# Patient Record
Sex: Female | Born: 1970
Health system: Southern US, Community
[De-identification: ages and names within clinical notes are randomized; demographics above are authoritative.]

## PROBLEM LIST (undated history)

## (undated) DIAGNOSIS — B009 Herpesviral infection, unspecified: Secondary | ICD-10-CM

## (undated) DIAGNOSIS — C439 Malignant melanoma of skin, unspecified: Secondary | ICD-10-CM

## (undated) DIAGNOSIS — G43109 Migraine with aura, not intractable, without status migrainosus: Secondary | ICD-10-CM

## (undated) DIAGNOSIS — Z9889 Other specified postprocedural states: Secondary | ICD-10-CM

## (undated) DIAGNOSIS — K219 Gastro-esophageal reflux disease without esophagitis: Secondary | ICD-10-CM

## (undated) DIAGNOSIS — R112 Nausea with vomiting, unspecified: Secondary | ICD-10-CM

## (undated) DIAGNOSIS — R87619 Unspecified abnormal cytological findings in specimens from cervix uteri: Secondary | ICD-10-CM

## (undated) HISTORY — PX: WRIST FRACTURE SURGERY: SHX121

## (undated) HISTORY — DX: Migraine with aura, not intractable, without status migrainosus: G43.109

## (undated) HISTORY — DX: Unspecified abnormal cytological findings in specimens from cervix uteri: R87.619

## (undated) HISTORY — DX: Gastro-esophageal reflux disease without esophagitis: K21.9

## (undated) HISTORY — DX: Malignant melanoma of skin, unspecified: C43.9

## (undated) HISTORY — DX: Herpesviral infection, unspecified: B00.9

---

## 1999-05-02 ENCOUNTER — Other Ambulatory Visit: Admission: RE | Admit: 1999-05-02 | Discharge: 1999-05-02 | Payer: Self-pay | Admitting: Obstetrics & Gynecology

## 1999-07-21 ENCOUNTER — Ambulatory Visit (HOSPITAL_COMMUNITY): Admission: RE | Admit: 1999-07-21 | Discharge: 1999-07-21 | Payer: Self-pay | Admitting: Internal Medicine

## 1999-11-30 ENCOUNTER — Inpatient Hospital Stay (HOSPITAL_COMMUNITY): Admission: AD | Admit: 1999-11-30 | Discharge: 1999-11-30 | Payer: Self-pay | Admitting: Obstetrics & Gynecology

## 1999-12-02 ENCOUNTER — Inpatient Hospital Stay (HOSPITAL_COMMUNITY): Admission: AD | Admit: 1999-12-02 | Discharge: 1999-12-04 | Payer: Self-pay | Admitting: Obstetrics and Gynecology

## 1999-12-30 ENCOUNTER — Other Ambulatory Visit: Admission: RE | Admit: 1999-12-30 | Discharge: 1999-12-30 | Payer: Self-pay | Admitting: Obstetrics & Gynecology

## 2000-09-18 ENCOUNTER — Encounter: Admission: RE | Admit: 2000-09-18 | Discharge: 2000-11-07 | Payer: Self-pay | Admitting: Internal Medicine

## 2001-01-08 ENCOUNTER — Other Ambulatory Visit: Admission: RE | Admit: 2001-01-08 | Discharge: 2001-01-08 | Payer: Self-pay | Admitting: Obstetrics & Gynecology

## 2001-09-22 ENCOUNTER — Inpatient Hospital Stay (HOSPITAL_COMMUNITY): Admission: AD | Admit: 2001-09-22 | Discharge: 2001-09-24 | Payer: Self-pay | Admitting: Obstetrics & Gynecology

## 2001-10-22 ENCOUNTER — Other Ambulatory Visit: Admission: RE | Admit: 2001-10-22 | Discharge: 2001-10-22 | Payer: Self-pay | Admitting: Obstetrics & Gynecology

## 2002-06-13 ENCOUNTER — Other Ambulatory Visit: Admission: RE | Admit: 2002-06-13 | Discharge: 2002-06-13 | Payer: Self-pay | Admitting: Obstetrics & Gynecology

## 2003-04-02 ENCOUNTER — Other Ambulatory Visit: Admission: RE | Admit: 2003-04-02 | Discharge: 2003-04-02 | Payer: Self-pay | Admitting: Obstetrics & Gynecology

## 2004-05-24 ENCOUNTER — Encounter: Admission: RE | Admit: 2004-05-24 | Discharge: 2004-05-24 | Payer: Self-pay | Admitting: Internal Medicine

## 2005-05-17 ENCOUNTER — Encounter: Admission: RE | Admit: 2005-05-17 | Discharge: 2005-05-17 | Payer: Self-pay | Admitting: Internal Medicine

## 2005-05-24 ENCOUNTER — Encounter: Admission: RE | Admit: 2005-05-24 | Discharge: 2005-05-24 | Payer: Self-pay | Admitting: Internal Medicine

## 2005-06-14 ENCOUNTER — Encounter: Admission: RE | Admit: 2005-06-14 | Discharge: 2005-06-14 | Payer: Self-pay | Admitting: Internal Medicine

## 2005-07-12 ENCOUNTER — Encounter: Admission: RE | Admit: 2005-07-12 | Discharge: 2005-07-12 | Payer: Self-pay | Admitting: Internal Medicine

## 2005-07-18 ENCOUNTER — Encounter: Admission: RE | Admit: 2005-07-18 | Discharge: 2005-07-18 | Payer: Self-pay | Admitting: Internal Medicine

## 2005-08-09 ENCOUNTER — Encounter: Admission: RE | Admit: 2005-08-09 | Discharge: 2005-08-09 | Payer: Self-pay | Admitting: Internal Medicine

## 2005-12-21 ENCOUNTER — Encounter: Admission: RE | Admit: 2005-12-21 | Discharge: 2005-12-21 | Payer: Self-pay | Admitting: Interventional Radiology

## 2006-01-23 ENCOUNTER — Encounter: Admission: RE | Admit: 2006-01-23 | Discharge: 2006-01-23 | Payer: Self-pay | Admitting: Obstetrics & Gynecology

## 2006-08-28 ENCOUNTER — Encounter: Admission: RE | Admit: 2006-08-28 | Discharge: 2006-08-28 | Payer: Self-pay | Admitting: Interventional Radiology

## 2006-09-13 ENCOUNTER — Encounter: Admission: RE | Admit: 2006-09-13 | Discharge: 2006-09-13 | Payer: Self-pay | Admitting: Interventional Radiology

## 2006-10-23 ENCOUNTER — Encounter: Admission: RE | Admit: 2006-10-23 | Discharge: 2006-10-23 | Payer: Self-pay | Admitting: Interventional Radiology

## 2010-05-01 ENCOUNTER — Encounter: Payer: Self-pay | Admitting: Internal Medicine

## 2010-05-01 ENCOUNTER — Encounter: Payer: Self-pay | Admitting: Interventional Radiology

## 2010-05-11 HISTORY — PX: COLPOSCOPY: SHX161

## 2010-08-26 NOTE — H&P (Signed)
[facility]  Patient:    Ashley Quinn, Ashley Quinn                  MRN: [id number] Adm. Date:  12/02/99 Attending:  Marina Gravel, M.D.                         History and Physical  DATE OF BIRTH:                06/04/1970  HISTORY OF PRESENT ILLNESS:   Ms. Ashley Quinn is a 40 year old white female, gravida 1, para 0, at 39+ weeks who presented to the office today with complaints of leakage of clear fluid and a large gush at 8:15 this morning. She reports occasional mild contractions.  No vaginal bleeding.  Active fetus, at least 10 movements per a 12-hour period.  Prenatal care has been at [facility], followed by Dr. Seymour Bars.  The primary complicating factor has been a history of grade 4 hydronephrosis of the left fetal kidney.  This has been followed on serial ultrasound.  Most recent measurement of 0.9 cm on the right and frank hydronephrosis on the left side.  This patients situation had been discussed with Dr. Conni Slipper at [facility].  Plan was to not induce the patient earlier than 39 weeks as it would not likely improve the hydronephrosis.  In addition, the patients pediatrician has been notified of the situation.  Recommendations from Dr. Clearance Coots are to obtain a renal ultrasound within the first week of life to begin assessment.  DATA CRITERIA:                Last menstrual period February 28, 1999, [facility] December 06, 1999, which is consistent with a 20-week ultrasound.  Remainder of the fetal anatomy was normal.  PRENATAL LABORATORIES:        O negative, Rh antibody screen negative.  RPR, hepatitis B, HIV negative, rubella immune, hemoglobin 14.2.  Patient was administered RhoGAM at 28 weeks.  Group B strep positive.  PAST MEDICAL HISTORY:         None.  PAST SURGICAL HISTORY:        Wrist surgery, February 2000.  MEDICATIONS:                  Prenatal vitamins.  ALLERGIES:                    None.  SOCIAL HISTORY:                No alcohol, tobacco, or other drugs.  PHYSICAL EXAMINATION:  VITAL SIGNS:                  Blood pressure 120/80, weight 166.4, fetal heart tones 140.  GENERAL:                      The patient is alert and oriented in no acute distress.  ABDOMEN:                      Soft and nontender.  Fundal height 39 cm.  No fundal tenderness.  PELVIC:                       Normal external female genitalia.  Speculum exam reveals copious amounts of frank amniotic fluid with vernix noted.  Pool nitrazine and ferning positive.  Vaginal exam:  Cervix is extremely posterior, approximately 1 cm dilated, 70% effaced, and 0 station.  ASSESSMENT:                   Term gravida 1, group B strep positive with spontaneous rupture of membranes today.  PLAN:                         Admit for Pitocin induction.  Penicillin for group B strep.  Pediatrician to be notified of the fetal hydronephrosis. DD:  12/02/99 TD:  12/02/99 Job: [id number]

## 2011-02-05 ENCOUNTER — Inpatient Hospital Stay (INDEPENDENT_AMBULATORY_CARE_PROVIDER_SITE_OTHER)
Admission: RE | Admit: 2011-02-05 | Discharge: 2011-02-05 | Disposition: A | Discharge status: Home / Self Care | Payer: 59 | Source: Ambulatory Visit | Attending: Family Medicine | Admitting: Family Medicine

## 2011-02-05 DIAGNOSIS — T2610XA Burn of cornea and conjunctival sac, unspecified eye, initial encounter: Secondary | ICD-10-CM

## 2011-06-05 ENCOUNTER — Other Ambulatory Visit: Payer: Self-pay | Admitting: Obstetrics and Gynecology

## 2011-06-05 DIAGNOSIS — R928 Other abnormal and inconclusive findings on diagnostic imaging of breast: Secondary | ICD-10-CM

## 2011-06-12 ENCOUNTER — Ambulatory Visit
Admission: RE | Admit: 2011-06-12 | Discharge: 2011-06-12 | Disposition: A | Discharge status: Home / Self Care | Payer: 59 | Source: Ambulatory Visit | Attending: Obstetrics and Gynecology | Admitting: Obstetrics and Gynecology

## 2011-06-12 DIAGNOSIS — R928 Other abnormal and inconclusive findings on diagnostic imaging of breast: Secondary | ICD-10-CM

## 2012-06-18 ENCOUNTER — Other Ambulatory Visit: Payer: Self-pay

## 2012-06-18 DIAGNOSIS — Z1231 Encounter for screening mammogram for malignant neoplasm of breast: Secondary | ICD-10-CM

## 2012-07-12 ENCOUNTER — Ambulatory Visit
Admission: RE | Admit: 2012-07-12 | Discharge: 2012-07-12 | Disposition: A | Discharge status: Home / Self Care | Payer: 59 | Source: Ambulatory Visit

## 2012-07-12 DIAGNOSIS — Z1231 Encounter for screening mammogram for malignant neoplasm of breast: Secondary | ICD-10-CM

## 2012-11-08 DIAGNOSIS — C439 Malignant melanoma of skin, unspecified: Secondary | ICD-10-CM

## 2012-11-08 HISTORY — DX: Malignant melanoma of skin, unspecified: C43.9

## 2012-11-08 HISTORY — PX: OTHER SURGICAL HISTORY: SHX169

## 2013-06-09 ENCOUNTER — Encounter: Payer: Self-pay | Admitting: Obstetrics and Gynecology

## 2013-06-23 ENCOUNTER — Ambulatory Visit: Payer: Self-pay | Admitting: Obstetrics and Gynecology

## 2013-06-25 ENCOUNTER — Encounter: Payer: Self-pay | Admitting: Obstetrics and Gynecology

## 2013-06-26 ENCOUNTER — Ambulatory Visit (INDEPENDENT_AMBULATORY_CARE_PROVIDER_SITE_OTHER): Payer: 59 | Admitting: Obstetrics and Gynecology

## 2013-06-26 ENCOUNTER — Encounter: Payer: Self-pay | Admitting: Obstetrics and Gynecology

## 2013-06-26 VITALS — BP 100/78 | HR 70 | Resp 16 | Ht 69.5 in | Wt 154.5 lb

## 2013-06-26 DIAGNOSIS — Z0000 Encounter for general adult medical examination without abnormal findings: Secondary | ICD-10-CM

## 2013-06-26 DIAGNOSIS — N920 Excessive and frequent menstruation with regular cycle: Secondary | ICD-10-CM

## 2013-06-26 DIAGNOSIS — Z01419 Encounter for gynecological examination (general) (routine) without abnormal findings: Secondary | ICD-10-CM

## 2013-06-26 LAB — POCT URINALYSIS DIPSTICK
BILIRUBIN UA: NEGATIVE
Glucose, UA: NEGATIVE
KETONES UA: NEGATIVE
Leukocytes, UA: NEGATIVE
Nitrite, UA: NEGATIVE
PH UA: 5
Protein, UA: NEGATIVE
Urobilinogen, UA: NEGATIVE

## 2013-06-26 LAB — CBC
HEMATOCRIT: 38.9 % (ref 36.0–46.0)
Hemoglobin: 13.2 g/dL (ref 12.0–15.0)
MCH: 29.1 pg (ref 26.0–34.0)
MCHC: 33.9 g/dL (ref 30.0–36.0)
MCV: 85.9 fL (ref 78.0–100.0)
Platelets: 189 10*3/uL (ref 150–400)
RBC: 4.53 MIL/uL (ref 3.87–5.11)
RDW: 13.8 % (ref 11.5–15.5)
WBC: 4.7 10*3/uL (ref 4.0–10.5)

## 2013-06-26 LAB — COMPREHENSIVE METABOLIC PANEL
ALBUMIN: 4.1 g/dL (ref 3.5–5.2)
ALK PHOS: 53 U/L (ref 39–117)
ALT: 14 U/L (ref 0–35)
AST: 17 U/L (ref 0–37)
BUN: 14 mg/dL (ref 6–23)
CO2: 27 mEq/L (ref 19–32)
Calcium: 9.3 mg/dL (ref 8.4–10.5)
Chloride: 103 mEq/L (ref 96–112)
Creat: 0.68 mg/dL (ref 0.50–1.10)
Glucose, Bld: 72 mg/dL (ref 70–99)
POTASSIUM: 4.1 meq/L (ref 3.5–5.3)
SODIUM: 135 meq/L (ref 135–145)
Total Bilirubin: 0.4 mg/dL (ref 0.2–1.2)
Total Protein: 6.6 g/dL (ref 6.0–8.3)

## 2013-06-26 LAB — LIPID PANEL
Cholesterol: 159 mg/dL (ref 0–200)
HDL: 71 mg/dL (ref >39)
LDL CALC: 79 mg/dL (ref 0–99)
TRIGLYCERIDES: 43 mg/dL (ref <150)
Total CHOL/HDL Ratio: 2.2 Ratio
VLDL: 9 mg/dL (ref 0–40)

## 2013-06-26 LAB — TSH: TSH: 1.142 u[IU]/mL (ref 0.350–4.500)

## 2013-06-26 NOTE — Patient Instructions (Signed)

## 2013-06-26 NOTE — Progress Notes (Addendum)
Patient ID: Ashley Quinn, female   DOB: 10-Jun-1970, 43 y.o.   MRN: [id number] GYNECOLOGY VISIT  PCP:   None  Referring provider:   HPI: 43 y.o.   Married  Caucasian  female   G2P2002 with Patient's last menstrual period was 06/13/2013.   here for  AEX.  Menses heavy starting January 2015. Bleeding through super tampon in less than 45 minutes.  Bled through clothes.  No pain or discomfort.   Received Rx for low dose OCPs last year and developed headaches so stopped.  Used Mirena IUD and did well with that.   History of superficial thrombosis and not DVT, during pregnancy.   New diagnosis of melanoma of the left thigh this year.  Having regular dermatology checks.  Hgb:     Urine:  Neg.  Addendum - This shows a trace of RBCs.  Asymptomatic.  Will monitor.   GYNECOLOGIC HISTORY: Patient's last menstrual period was 06/13/2013. Sexually active:  yes Partner preference: female Contraception:   vasectomy Menopausal hormone therapy: no DES exposure:   no Blood transfusions:  no  Sexually transmitted diseases:   no GYN procedures and prior surgeries: Colposcopy 2012 but no treatment to cervix.  Last mammogram:  07/2012 wnl:[facility]               Last pap and high risk HPV testing:   2013 HQI:ONGEXB of HPV testing History of abnormal pap smear:  2002 had AGUS pap.  2012 had pap showing LGSIL. Colposcopy showed atypia with HPV changes.   No treatment to cervix.  Paps returned to normal on repeat pap.   OB History   Grav Para Term Preterm Abortions TAB SAB Ect Mult Living   2 2 2       2        LIFESTYLE: Exercise:   Weights and aerobics            Tobacco:   no Alcohol:     2-3 glasses of beer/wine per week Drug use:  no  OTHER HEALTH MAINTENANCE: Tetanus/TDap:  Up to date through work ([employer]) Gardisil:              n/a Influenza:            01/2013 Zostavax:             n/a  Bone density:       n/a Colonoscopy:       n/a  Cholesterol check:    wnl  Family History  Problem Relation Age of Onset  . Breast cancer Mother   . Hypertension Mother   . Osteoporosis Mother   . Heart disease Paternal Grandmother   . Hyperlipidemia Father   . Prostate cancer Father   . Cancer Father     bladder ca    There are no active problems to display for this patient.  Past Medical History  Diagnosis Date  . DVT (deep venous thrombosis)   . GERD (gastroesophageal reflux disease)   . Abnormal Pap smear of cervix   . Melanoma 11/2012    left thigh    Past Surgical History  Procedure Laterality Date  . Wrist fracture surgery Left   . Colposcopy  05/2010    Mild Dysplasia  . Melanoma removal  11/2012    -left thigh    ALLERGIES: Review of patient's allergies indicates no known allergies.  Current Outpatient Prescriptions  Medication Sig Dispense Refill  . b complex vitamins tablet Take 1 tablet  by mouth daily.      . cetirizine (ZYRTEC) 10 MG tablet Take 10 mg by mouth daily.      Marland Kitchen glucosamine-chondroitin 500-400 MG tablet Take 1 tablet by mouth 3 (three) times daily.      . Multiple Vitamin (MULTIVITAMIN) tablet Take 1 tablet by mouth daily.       No current facility-administered medications for this visit.     ROS:  Pertinent items are noted in HPI.  SOCIAL HISTORY:  Works for [employer].   PHYSICAL EXAMINATION:    BP 100/78  Pulse 70  Resp 16  Ht 5' 9.5" (1.765 m)  Wt 154 lb 8 oz (70.081 kg)  BMI 22.50 kg/m2  LMP 06/13/2013   Wt Readings from Last 3 Encounters:  06/26/13 154 lb 8 oz (70.081 kg)     Ht Readings from Last 3 Encounters:  06/26/13 5' 9.5" (1.765 m)    General appearance: alert, cooperative and appears stated age Head: Normocephalic, without obvious abnormality, atraumatic Neck: no adenopathy, supple, symmetrical, trachea midline and thyroid not enlarged, symmetric, no tenderness/mass/nodules Lungs: clear to auscultation bilaterally Breasts: Inspection negative, No nipple retraction or dimpling,  No nipple discharge or bleeding, No axillary or supraclavicular adenopathy, Normal to palpation without dominant masses Heart: regular rate and rhythm Abdomen: soft, non-tender; no masses,  no organomegaly Extremities: extremities normal, atraumatic, no cyanosis or edema Skin: Skin color, texture, turgor normal. No rashes or lesions Lymph nodes: Cervical, supraclavicular, and axillary nodes normal. No abnormal inguinal nodes palpated Neurologic: Grossly normal  Pelvic: External genitalia:  no lesions              Urethra:  normal appearing urethra with no masses, tenderness or lesions              Bartholins and Skenes: normal                 Vagina: normal appearing vagina with normal color and discharge, no lesions              Cervix: normal appearance              Pap and high risk HPV testing done: yes.            Bimanual Exam:  Uterus:  uterus is normal size, shape, consistency and nontender                                      Adnexa: normal adnexa in size, nontender and no masses                                      Rectovaginal: Confirms                                      Anus:  normal sphincter tone, no lesions  ASSESSMENT  Normal gynecologic exam. History of cervical atypia and HPV.  History of AGUS. Menorrhagia.  PLAN  Mammogram recommended yearly.  Patient will schedule in April 2015.  Pap smear and high risk HPV testing performed.  Counseled on self breast exam. Return for pelvic ultrasound, sonohysterogram, and EMB. Lipid profile, CBC, CMP, TSH. Return annually or prn   An After Visit Summary was printed and given  to the patient.

## 2013-06-30 ENCOUNTER — Telehealth: Payer: Self-pay | Admitting: Obstetrics and Gynecology

## 2013-06-30 LAB — IPS PAP TEST WITH HPV

## 2013-06-30 NOTE — Telephone Encounter (Signed)
Left message for patient to call back  

## 2013-07-02 NOTE — Telephone Encounter (Signed)
Left message to call back. Need to go over benefits for and scheduled pus/endo bx

## 2013-07-07 NOTE — Telephone Encounter (Signed)
Left message for patient to call back  

## 2013-07-08 NOTE — Telephone Encounter (Signed)
Left message for patient to call back  

## 2013-07-08 NOTE — Telephone Encounter (Signed)
Patient returning Sabrina's call. °

## 2013-07-08 NOTE — Telephone Encounter (Signed)
Spoke with patient. Advised of $136.56 patient responsibility for SHGM/Endo BX. Scheduled procedures. Advised patient of cancelllation policy. Mailed  the In-Office Procedure form. Advising patient of appointment date and time, co-pay, and cancellation policy.

## 2013-07-23 ENCOUNTER — Other Ambulatory Visit: Payer: Self-pay

## 2013-07-23 DIAGNOSIS — Z1231 Encounter for screening mammogram for malignant neoplasm of breast: Secondary | ICD-10-CM

## 2013-07-24 ENCOUNTER — Other Ambulatory Visit: Payer: Self-pay | Admitting: Obstetrics and Gynecology

## 2013-07-24 ENCOUNTER — Encounter: Payer: Self-pay | Admitting: Obstetrics and Gynecology

## 2013-07-24 ENCOUNTER — Ambulatory Visit (INDEPENDENT_AMBULATORY_CARE_PROVIDER_SITE_OTHER): Payer: 59

## 2013-07-24 ENCOUNTER — Ambulatory Visit (INDEPENDENT_AMBULATORY_CARE_PROVIDER_SITE_OTHER): Payer: 59 | Admitting: Obstetrics and Gynecology

## 2013-07-24 VITALS — BP 110/70 | HR 64 | Ht 69.5 in | Wt 154.0 lb

## 2013-07-24 DIAGNOSIS — N9489 Other specified conditions associated with female genital organs and menstrual cycle: Secondary | ICD-10-CM

## 2013-07-24 DIAGNOSIS — N920 Excessive and frequent menstruation with regular cycle: Secondary | ICD-10-CM

## 2013-07-24 NOTE — Progress Notes (Signed)
Subjective  Patient is here for pelvic ultrasound, endometrial biopsy, sonohysterogram for heavy menses. Bleeds through clothing.  Some low back ache with menses treatable with ibuprofen.  Had migraine headaches with ultralow dose birth control pills.  Declines future childbearing.  Husband has vasectomy.   Objective  See ultrasound below -  Uterus normal with EMS 10.20 mm, no abnormal flow.  Ovaries normal.     Procedure - sonohysterogram Consent performed. Speculum placed in vagina. Sterile prep of cervix with betadine. Tenaculum to anterior cervical lip.  Cannula placed inside endometrial cavity without difficult. Speculum removed. Sterile saline injected.     13 x 10 mm         filling defect noted. Cannula removed. No complication.   Procedure - endometrial biopsy Consent performed. Speculum place in vagina.  Sterile prep of cervix with betadine.  Tenaculum to anterior cervical lip.  Pipelle placed to  8 cm        cm without difficulty twice. Tissue obtained and sent to pathology. Speculum removed.  No complications.  Assessment  Menorrhagia. Endometrial mass. Possible arcuate uterus.   Plan  Wait for EMB results. I discussed options for care depending on the EMB. Patient may need at least a hysteroscopy with dilation and curettage and polypectomy.  This can be performed in conjunction with endometrial ablation - Thermachoice and laparoscopic bilateral salpingectomy.  Patient favoring this option.  The hysteroscopy could also be followed with Mirena IUD insertion or other medical management.  Hysterectomy with bilateral salpingectomy was also discussed.   Patient will consider options.   25 minutes face to face time of which over 50% was spent in counseling.   After visit summary to patient.

## 2013-07-24 NOTE — Patient Instructions (Signed)

## 2013-07-28 LAB — IPS OTHER TISSUE BIOPSY

## 2013-07-30 ENCOUNTER — Ambulatory Visit
Admission: RE | Admit: 2013-07-30 | Discharge: 2013-07-30 | Disposition: A | Discharge status: Home / Self Care | Payer: 59 | Source: Ambulatory Visit

## 2013-07-30 DIAGNOSIS — Z1231 Encounter for screening mammogram for malignant neoplasm of breast: Secondary | ICD-10-CM

## 2013-08-01 ENCOUNTER — Telehealth: Payer: Self-pay | Admitting: *Deleted

## 2013-08-01 NOTE — Telephone Encounter (Signed)
Patient notified benign endometrial biopsy.  Encouraged to call back if decides she would like to precede with treatment for menorrhagia.  Encounter closed.

## 2013-08-01 NOTE — Telephone Encounter (Signed)
Message copied by Jaymes Graff on Fri Aug 01, 2013  6:16 PM ------      Message from: [place]      Created: Tue Jul 29, 2013  2:30 PM       Please inform patient of negative endometrial biopsy.      Patient is considering an endometrial ablation and bilateral salpingectomy.       Please let me know how I can assist her with her menorrhagia. ------

## 2014-01-29 ENCOUNTER — Telehealth: Payer: Self-pay | Admitting: Obstetrics and Gynecology

## 2014-01-29 NOTE — Telephone Encounter (Signed)
LMTCB about a canceled appointment with Dr. Quincy Simmonds.

## 2014-01-29 NOTE — Telephone Encounter (Signed)
Thank you for reaching out to this appointment.  I will look forward to seeing her at a different time of the day as we no longer offer the 7 am time slots.

## 2014-02-03 ENCOUNTER — Encounter: Payer: Self-pay | Admitting: Sports Medicine

## 2014-02-03 ENCOUNTER — Ambulatory Visit (INDEPENDENT_AMBULATORY_CARE_PROVIDER_SITE_OTHER): Payer: 59 | Admitting: Sports Medicine

## 2014-02-03 VITALS — BP 104/73 | Ht 70.0 in | Wt 150.0 lb

## 2014-02-03 DIAGNOSIS — M25531 Pain in right wrist: Secondary | ICD-10-CM | POA: Insufficient documentation

## 2014-02-03 NOTE — Progress Notes (Signed)
Patient ID: Varsha S. Riffe-Guyer, female   DOB: [birth year], 43 y.o.   MRN: [id number]  Patient enters w 5 mos of Rt wrist pain that is sharp and intermittent/ occurs lateral ulnar border at wrist Not aware of a specific injury She does note that sometimes lifting or body pump may result in some soreness and attacks However, at other times sharp pain comes with lifting a coffee cup or normal activity  No swelling NO HX arthritis  Left handed and hx of scaphoid FX left wrist after a fall in HS basketball This was pinned After coming to [place] revised by Dr Amedeo Plenty Has some limited motion of left wrist  PEXAM NAD BP 104/73  Ht 5\' 10"  (1.778 m)  Wt 150 lb (68.04 kg)  BMI 21.52 kg/m2  Left wrist with scarring and decrease in full flexion and extension  RT wrist Normal range of motion Prominent ulna but not excessive mobility May be neg ulnar variance slt weakness on grip but other motions are strong Area of pain is over ECU tendon but not swollen today and I cannot get it to sublux

## 2014-02-03 NOTE — Assessment & Plan Note (Signed)
We will use a compression sleeve over the wrist for 6 weeks Avoid ulnar deviation HEP with light weight for wrist flexion and ext/ hand grip  If not better in 6 weeks we will do full scan with [place] and consider XR

## 2014-02-09 ENCOUNTER — Encounter: Payer: Self-pay | Admitting: Sports Medicine

## 2014-06-30 ENCOUNTER — Ambulatory Visit (INDEPENDENT_AMBULATORY_CARE_PROVIDER_SITE_OTHER): Payer: 59 | Admitting: Certified Nurse Midwife

## 2014-06-30 ENCOUNTER — Encounter: Payer: Self-pay | Admitting: Certified Nurse Midwife

## 2014-06-30 VITALS — BP 98/60 | HR 88 | Resp 16 | Ht 69.5 in | Wt 148.8 lb

## 2014-06-30 DIAGNOSIS — Z01419 Encounter for gynecological examination (general) (routine) without abnormal findings: Secondary | ICD-10-CM

## 2014-06-30 DIAGNOSIS — Z124 Encounter for screening for malignant neoplasm of cervix: Secondary | ICD-10-CM | POA: Diagnosis not present

## 2014-06-30 DIAGNOSIS — Z0000 Encounter for general adult medical examination without abnormal findings: Secondary | ICD-10-CM

## 2014-06-30 DIAGNOSIS — R319 Hematuria, unspecified: Secondary | ICD-10-CM | POA: Diagnosis not present

## 2014-06-30 LAB — URINALYSIS, MICROSCOPIC ONLY
Bacteria, UA: NONE SEEN
Casts: NONE SEEN
Crystals: NONE SEEN
SQUAMOUS EPITHELIAL / LPF: NONE SEEN

## 2014-06-30 LAB — POCT URINALYSIS DIPSTICK
Leukocytes, UA: NEGATIVE
Urobilinogen, UA: NEGATIVE
pH, UA: 5

## 2014-06-30 LAB — TSH: TSH: 0.907 u[IU]/mL (ref 0.350–4.500)

## 2014-06-30 LAB — CBC
HCT: 43.2 % (ref 36.0–46.0)
Hemoglobin: 14.5 g/dL (ref 12.0–15.0)
MCH: 29.4 pg (ref 26.0–34.0)
MCHC: 33.6 g/dL (ref 30.0–36.0)
MCV: 87.4 fL (ref 78.0–100.0)
MPV: 9.9 fL (ref 8.6–12.4)
Platelets: 238 10*3/uL (ref 150–400)
RBC: 4.94 MIL/uL (ref 3.87–5.11)
RDW: 13.1 % (ref 11.5–15.5)
WBC: 5.3 10*3/uL (ref 4.0–10.5)

## 2014-06-30 LAB — COMPREHENSIVE METABOLIC PANEL
ALBUMIN: 4.3 g/dL (ref 3.5–5.2)
ALT: 24 U/L (ref 0–35)
AST: 20 U/L (ref 0–37)
Alkaline Phosphatase: 59 U/L (ref 39–117)
BUN: 20 mg/dL (ref 6–23)
CALCIUM: 9.3 mg/dL (ref 8.4–10.5)
CO2: 28 meq/L (ref 19–32)
Chloride: 105 mEq/L (ref 96–112)
Creat: 0.66 mg/dL (ref 0.50–1.10)
GLUCOSE: 80 mg/dL (ref 70–99)
POTASSIUM: 4.9 meq/L (ref 3.5–5.3)
SODIUM: 138 meq/L (ref 135–145)
TOTAL PROTEIN: 6.7 g/dL (ref 6.0–8.3)
Total Bilirubin: 0.5 mg/dL (ref 0.2–1.2)

## 2014-06-30 LAB — LIPID PANEL
CHOL/HDL RATIO: 2.7 ratio
Cholesterol: 157 mg/dL (ref 0–200)
HDL: 59 mg/dL (ref >=46)
LDL CALC: 85 mg/dL (ref 0–99)
Triglycerides: 66 mg/dL (ref <150)
VLDL: 13 mg/dL (ref 0–40)

## 2014-06-30 LAB — HEMOGLOBIN, FINGERSTICK: Hemoglobin, fingerstick: 13.8 g/dL (ref 12.0–16.0)

## 2014-06-30 NOTE — Patient Instructions (Signed)

## 2014-06-30 NOTE — Progress Notes (Signed)
44 y.o. G71P2002 Married  Caucasian Fe here for annual exam.  Periods unchanged, bleeding profile has not increased. Has some changes with duration and amount. Not interested in cycle control and will advise. Denies UTI symptoms. Sees PCP prn. No health issues today.   Patient's last menstrual period was 06/07/2014.          Sexually active: Yes.    The current method of family planning is Husband has vasectomy.    Exercising: Yes.    body pump, walking 4x/wk Smoker:  no  Health Maintenance: Pap:  06/2013 NEG HR HPV negative -HX of abnormal pap: 2002 AGUS Pap; 05/26/10 LGSIL colpo bx done atypia with HPV effect MMG:  07/31/13 Bi-Rads 1: Negative  Colonoscopy:  Never had one BMD:   Never had one TDaP:  Within 10 years  Labs: Hgb: 13.8 ; Urine: RBC's ++   reports that she has never smoked. She has never used smokeless tobacco. She reports that she drinks about 1.2 - 1.8 oz of alcohol per week. She reports that she does not use illicit drugs.  Past Medical History  Diagnosis Date  . DVT (deep venous thrombosis)   . GERD (gastroesophageal reflux disease)   . Abnormal Pap smear of cervix   . Melanoma 11/2012    left thigh    Past Surgical History  Procedure Laterality Date  . Wrist fracture surgery Left   . Colposcopy  05/2010    Mild Dysplasia  . Melanoma removal  11/2012    -left thigh    Current Outpatient Prescriptions  Medication Sig Dispense Refill  . b complex vitamins tablet Take 1 tablet by mouth daily.    . cetirizine (ZYRTEC) 10 MG tablet Take 10 mg by mouth daily.    Marland Kitchen glucosamine-chondroitin 500-400 MG tablet Take 1 tablet by mouth 3 (three) times daily.    . Multiple Vitamin (MULTIVITAMIN) tablet Take 1 tablet by mouth daily.    . Probiotic Product (PROBIOTIC PO) Take by mouth.     No current facility-administered medications for this visit.    Family History  Problem Relation Age of Onset  . Breast cancer Mother   . Hypertension Mother   . Osteoporosis Mother    . Heart disease Paternal Grandmother   . Hyperlipidemia Father   . Prostate cancer Father   . Cancer Father     bladder ca    ROS:  Pertinent items are noted in HPI.  Otherwise, a comprehensive ROS was negative.  Exam:   BP 98/60 mmHg  Pulse 88  Resp 16  Ht 5' 9.5" (1.765 m)  Wt 148 lb 12.8 oz (67.495 kg)  BMI 21.67 kg/m2  LMP 06/07/2014 Height: 5' 9.5" (176.5 cm) Ht Readings from Last 3 Encounters:  06/30/14 5' 9.5" (1.765 m)  02/03/14 5\' 10"  (1.778 m)  07/24/13 5' 9.5" (1.765 m)    General appearance: alert, cooperative and appears stated age Head: Normocephalic, without obvious abnormality, atraumatic Neck: no adenopathy, supple, symmetrical, trachea midline and thyroid normal to inspection and palpation Lungs: clear to auscultation bilaterally Breasts: normal appearance, no masses or tenderness, No nipple retraction or dimpling, No nipple discharge or bleeding, No axillary or supraclavicular adenopathy Heart: regular rate and rhythm Abdomen: soft, non-tender; no masses,  no organomegaly Extremities: extremities normal, atraumatic, no cyanosis or edema Skin: Skin color, texture, turgor normal. No rashes or lesions Lymph nodes: Cervical, supraclavicular, and axillary nodes normal. No abnormal inguinal nodes palpated Neurologic: Grossly normal   Pelvic: External genitalia:  no lesions              Urethra:  normal appearing urethra with no masses, tenderness or lesions              Bartholin's and Skene's: normal                 Vagina: normal appearing vagina with normal color and discharge, no lesions              Cervix: normal, non tender, no lesions              Pap taken: Yes.   Bimanual Exam:  Uterus:  normal size, contour, position, consistency, mobility, non-tender              Adnexa: normal adnexa and no mass, fullness, tenderness               Rectovaginal: Confirms               Anus:  normal sphincter tone, no lesions, slight increase in pink and slight  irritated appearance, non tender, shown to patient in mirror and instructions given for care  Chaperone present: Yes  A:  Well Woman with normal exam  History of menorrhagia with no increase, stable at present, no anemia HGB 13.8, not interested in cycle control  R/O UTI RBC in urine, no symptoms  Family history of breast cancer mother age 14 ? BRACA screening  Rectal irritation ? Dryness  Screening labs  P:   Reviewed health and wellness pertinent to exam  Patient aware of warning signs with bleeding and will advise if change  Encouraged to increase water intake, aware of UTI symptoms,  Lab Urine micro  Patient will check with mother on screening  Patient instructed to use 1/2% hydrocortisone to area for no longer than 5 days and Balmex protection if needed. Advise if not clearing.  Labs: Lipid panel, CMP, TSH,CBC, Vitamin D  Pap smear taken today with HPV reflex   counseled on breast self exam, mammography screening, adequate intake of calcium and vitamin D, diet and exercise  return annually or prn  An After Visit Summary was printed and given to the patient.

## 2014-07-01 LAB — VITAMIN D 25 HYDROXY (VIT D DEFICIENCY, FRACTURES): Vit D, 25-Hydroxy: 32 ng/mL (ref 30–100)

## 2014-07-02 LAB — IPS PAP TEST WITH REFLEX TO HPV

## 2014-07-08 ENCOUNTER — Other Ambulatory Visit: Payer: Self-pay

## 2014-07-08 DIAGNOSIS — Z1231 Encounter for screening mammogram for malignant neoplasm of breast: Secondary | ICD-10-CM

## 2014-07-08 NOTE — Progress Notes (Signed)
Reviewed personally.  M. Suzanne Orel Hord, MD.  

## 2014-07-09 ENCOUNTER — Ambulatory Visit: Payer: Self-pay | Admitting: Obstetrics and Gynecology

## 2014-08-07 ENCOUNTER — Ambulatory Visit
Admission: RE | Admit: 2014-08-07 | Discharge: 2014-08-07 | Disposition: A | Discharge status: Home / Self Care | Payer: 59 | Source: Ambulatory Visit

## 2014-08-07 DIAGNOSIS — Z1231 Encounter for screening mammogram for malignant neoplasm of breast: Secondary | ICD-10-CM

## 2015-06-17 DIAGNOSIS — Z9889 Other specified postprocedural states: Secondary | ICD-10-CM | POA: Diagnosis not present

## 2015-06-17 DIAGNOSIS — M25532 Pain in left wrist: Secondary | ICD-10-CM | POA: Diagnosis not present

## 2015-07-01 ENCOUNTER — Other Ambulatory Visit: Payer: Self-pay | Admitting: Obstetrics and Gynecology

## 2015-07-01 ENCOUNTER — Encounter: Payer: Self-pay | Admitting: Obstetrics and Gynecology

## 2015-07-01 ENCOUNTER — Ambulatory Visit (INDEPENDENT_AMBULATORY_CARE_PROVIDER_SITE_OTHER): Payer: 59 | Admitting: Obstetrics and Gynecology

## 2015-07-01 VITALS — BP 112/74 | HR 72 | Ht 70.5 in | Wt 156.0 lb

## 2015-07-01 DIAGNOSIS — D649 Anemia, unspecified: Secondary | ICD-10-CM | POA: Diagnosis not present

## 2015-07-01 DIAGNOSIS — Z01419 Encounter for gynecological examination (general) (routine) without abnormal findings: Secondary | ICD-10-CM | POA: Diagnosis not present

## 2015-07-01 DIAGNOSIS — Z0000 Encounter for general adult medical examination without abnormal findings: Secondary | ICD-10-CM | POA: Diagnosis not present

## 2015-07-01 DIAGNOSIS — Z1151 Encounter for screening for human papillomavirus (HPV): Secondary | ICD-10-CM | POA: Diagnosis not present

## 2015-07-01 LAB — LIPID PANEL
CHOL/HDL RATIO: 1.9 ratio (ref <=5.0)
CHOLESTEROL: 176 mg/dL (ref 125–200)
HDL: 94 mg/dL (ref >=46)
LDL Cholesterol: 71 mg/dL (ref <130)
TRIGLYCERIDES: 55 mg/dL (ref <150)
VLDL: 11 mg/dL (ref <30)

## 2015-07-01 LAB — POCT URINALYSIS DIPSTICK
Bilirubin, UA: NEGATIVE
Blood, UA: NEGATIVE
GLUCOSE UA: NEGATIVE
Ketones, UA: NEGATIVE
Leukocytes, UA: NEGATIVE
NITRITE UA: NEGATIVE
PROTEIN UA: NEGATIVE
UROBILINOGEN UA: NEGATIVE
pH, UA: 7

## 2015-07-01 LAB — HEMOGLOBIN, FINGERSTICK

## 2015-07-01 LAB — COMPREHENSIVE METABOLIC PANEL
ALT: 30 U/L — AB (ref 6–29)
AST: 31 U/L (ref 10–35)
Albumin: 4.2 g/dL (ref 3.6–5.1)
Alkaline Phosphatase: 53 U/L (ref 33–115)
BUN: 13 mg/dL (ref 7–25)
CHLORIDE: 103 mmol/L (ref 98–110)
CO2: 27 mmol/L (ref 20–31)
CREATININE: 0.72 mg/dL (ref 0.50–1.10)
Calcium: 9.2 mg/dL (ref 8.6–10.2)
GLUCOSE: 73 mg/dL (ref 65–99)
Potassium: 3.9 mmol/L (ref 3.5–5.3)
SODIUM: 138 mmol/L (ref 135–146)
TOTAL PROTEIN: 7.1 g/dL (ref 6.1–8.1)
Total Bilirubin: 0.5 mg/dL (ref 0.2–1.2)

## 2015-07-01 LAB — CBC
HEMATOCRIT: 35 % — AB (ref 36.0–46.0)
Hemoglobin: 11.2 g/dL — ABNORMAL LOW (ref 12.0–15.0)
MCH: 24.1 pg — AB (ref 26.0–34.0)
MCHC: 32 g/dL (ref 30.0–36.0)
MCV: 75.3 fL — AB (ref 78.0–100.0)
MPV: 10.8 fL (ref 8.6–12.4)
PLATELETS: 265 10*3/uL (ref 150–400)
RBC: 4.65 MIL/uL (ref 3.87–5.11)
RDW: 14.4 % (ref 11.5–15.5)
WBC: 7.3 10*3/uL (ref 4.0–10.5)

## 2015-07-01 NOTE — Addendum Note (Signed)
Addended by: Abelino Derrick C on: 07/01/2015 09:00 AM   Modules accepted: Orders, SmartSet

## 2015-07-01 NOTE — Progress Notes (Signed)
Patient ID: Ashley Quinn, female   DOB: 1971-04-10, 45 y.o.   MRN: [id number]  45 y.o. G19P2004 Married Caucasian female here for annual exam.    3 issues today: - Heartburn regularly.  Taking Prilosec for 14 days every other month. 2 coffees and one wine per 24 hours. States she noted the red wine definitely makes the reflux worse. Takes Advil 600 mg once a week for wrist pain.  - Hx of wrist fracture in past.  Did a cortisone injection last week.  May have future surgery.  Wants to stop her period for 3 - 5 months without going on the pill.  - Has very heavy menses.  Using ultra tampons. Used Mirena in the past without problems.   PCP:   None  Patient's last menstrual period was 06/24/2015 (exact date).          Sexually active: Yes.    The current method of family planning is vasectomy.    Exercising: Yes.    cardio and weights Smoker:  no  Health Maintenance: Pap:  06/30/14, Negative History of abnormal Pap:  Yes, 2002 AGUS Pap; 05/26/10 LGSIL colpo bx done atypia with HPV effect MMG: 08/07/14, 3D, Bi-Rads 1:  [facility] Colonoscopy:  Never BMD:   Never  Result  N/A TDaP:  UTD, [facility] Employee Screening Labs:   Urine today: Negative    reports that she has never smoked. She has never used smokeless tobacco. She reports that she drinks about 1.2 - 1.8 oz of alcohol per week. She reports that she does not use illicit drugs.  Past Medical History  Diagnosis Date  . DVT (deep venous thrombosis) ([place])   . GERD (gastroesophageal reflux disease)   . Abnormal Pap smear of cervix   . Melanoma ([place]) 11/2012    left thigh    Past Surgical History  Procedure Laterality Date  . Wrist fracture surgery Left   . Colposcopy  05/2010    Mild Dysplasia  . Melanoma removal  11/2012    -left thigh    Current Outpatient Prescriptions  Medication Sig Dispense Refill  . cetirizine (ZYRTEC) 10 MG tablet Take 10 mg by mouth daily.    . Multiple Vitamin (MULTIVITAMIN) tablet  Take 1 tablet by mouth daily.    Marland Kitchen omeprazole (PRILOSEC OTC) 20 MG tablet Take 20 mg by mouth daily as needed.    . Probiotic Product (PROBIOTIC PO) Take by mouth.     No current facility-administered medications for this visit.    Family History  Problem Relation Age of Onset  . Breast cancer Mother   . Hypertension Mother   . Osteoporosis Mother   . Heart disease Paternal Grandmother   . Hyperlipidemia Father   . Prostate cancer Father   . Cancer Father     bladder ca    ROS:  Pertinent items are noted in HPI.  Otherwise, a comprehensive ROS was negative.  Exam:   BP 112/74 mmHg  Pulse 72  Ht 5' 10.5" (1.791 m)  Wt 156 lb (70.761 kg)  BMI 22.06 kg/m2  LMP 06/24/2015 (Exact Date)    General appearance: alert, cooperative and appears stated age Head: Normocephalic, without obvious abnormality, atraumatic Neck: no adenopathy, supple, symmetrical, trachea midline and thyroid normal to inspection and palpation Lungs: clear to auscultation bilaterally Breasts: normal appearance, no masses or tenderness, Inspection negative, No nipple retraction or dimpling, No nipple discharge or bleeding, No axillary or supraclavicular adenopathy Heart: regular rate and rhythm  Abdomen: soft, non-tender; bowel sounds normal; no masses,  no organomegaly Extremities: extremities normal, atraumatic, no cyanosis or edema Skin: Skin color, texture, turgor normal. No rashes or lesions Lymph nodes: Cervical, supraclavicular, and axillary nodes normal. No abnormal inguinal nodes palpated Neurologic: Grossly normal  Pelvic: External genitalia:  no lesions              Urethra:  normal appearing urethra with no masses, tenderness or lesions              Bartholins and Skenes: normal                 Vagina: normal appearing vagina with normal color and discharge, no lesions              Cervix: no lesions              Pap taken: Yes.   Bimanual Exam:  Uterus:  normal size, contour, position,  consistency, mobility, non-tender              Adnexa: normal adnexa and no mass, fullness, tenderness              Rectovaginal: Yes.  .  Confirms.              Anus:  normal sphincter tone, no lesions  Chaperone was present for exam.  Assessment:   Well woman visit with normal exam. Remote hx of AGUS pap and LGSIL. Menorrhagia.  Desire for amenorrhea. FH of breast cancer in mother.  Hx DVT. Dyspepsia.  Discussed reduction in irritant use.  OK for daily Pepcid, Zantac.  If persists, to GI for potential endoscopy.  Hx melanoma.  Plan: Yearly mammogram recommended after age 24.  Recommended self breast exam.  Pap and HR HPV as above. Discussed Calcium, Vitamin D, regular exercise program including cardiovascular and weight bearing exercise. Labs performed.  Yes.  .   See orders.  Cholesterol, CMP, CBC. Refills given on medications.  No..    Discussed Mirena IUD.  Information to patient in verbal and written form.   See dermatology regularly for appointments.  Follow up annually and prn.      After visit summary provided.

## 2015-07-01 NOTE — Patient Instructions (Signed)

## 2015-07-02 ENCOUNTER — Other Ambulatory Visit: Payer: Self-pay | Admitting: Obstetrics and Gynecology

## 2015-07-02 DIAGNOSIS — R7989 Other specified abnormal findings of blood chemistry: Secondary | ICD-10-CM

## 2015-07-02 DIAGNOSIS — R945 Abnormal results of liver function studies: Principal | ICD-10-CM

## 2015-07-03 LAB — IRON: IRON: 38 ug/dL — AB (ref 40–190)

## 2015-07-03 LAB — FERRITIN: FERRITIN: 7 ng/mL — AB (ref 10–232)

## 2015-07-05 ENCOUNTER — Other Ambulatory Visit: Payer: Self-pay

## 2015-07-05 DIAGNOSIS — Z1231 Encounter for screening mammogram for malignant neoplasm of breast: Secondary | ICD-10-CM

## 2015-07-05 LAB — IPS PAP TEST WITH HPV

## 2015-08-09 ENCOUNTER — Ambulatory Visit
Admission: RE | Admit: 2015-08-09 | Discharge: 2015-08-09 | Disposition: A | Discharge status: Home / Self Care | Payer: 59 | Source: Ambulatory Visit

## 2015-08-09 DIAGNOSIS — Z1231 Encounter for screening mammogram for malignant neoplasm of breast: Secondary | ICD-10-CM | POA: Diagnosis not present

## 2015-09-07 ENCOUNTER — Encounter: Payer: Self-pay | Admitting: Emergency Medicine

## 2015-09-08 ENCOUNTER — Telehealth: Payer: Self-pay

## 2015-09-08 NOTE — Telephone Encounter (Signed)
-----   Message from Nunzio Cobbs, MD sent at 09/06/2015 10:04 AM EDT ----- Please let patient know that she is due to a repeat liver function panel.  She came up in my reminder box with EPIC.  Thanks,   Brook  ----- Message -----    From: SYSTEM    Sent: 09/06/2015  12:04 AM      To: Nunzio Cobbs, MD

## 2015-09-08 NOTE — Telephone Encounter (Signed)
Left message to call Briceson Broadwater at [phone]. 

## 2015-09-16 NOTE — Telephone Encounter (Signed)
Left message to call Bralynn Velador at [phone]. 

## 2015-09-21 NOTE — Telephone Encounter (Signed)
Dr.Silva, I have attempted to reach this patient x 2 without return call. Please advise.

## 2015-09-21 NOTE — Telephone Encounter (Signed)
I have closed the encounter after reviewing her labs.

## 2015-09-24 ENCOUNTER — Other Ambulatory Visit (INDEPENDENT_AMBULATORY_CARE_PROVIDER_SITE_OTHER): Payer: 59

## 2015-09-24 DIAGNOSIS — R7989 Other specified abnormal findings of blood chemistry: Secondary | ICD-10-CM | POA: Diagnosis not present

## 2015-09-24 DIAGNOSIS — R945 Abnormal results of liver function studies: Principal | ICD-10-CM

## 2015-09-24 DIAGNOSIS — R799 Abnormal finding of blood chemistry, unspecified: Secondary | ICD-10-CM | POA: Diagnosis not present

## 2015-09-24 LAB — HEPATIC FUNCTION PANEL
ALK PHOS: 55 U/L (ref 33–115)
ALT: 12 U/L (ref 6–29)
AST: 18 U/L (ref 10–35)
Albumin: 4.2 g/dL (ref 3.6–5.1)
BILIRUBIN DIRECT: 0.1 mg/dL (ref <=0.2)
BILIRUBIN TOTAL: 0.4 mg/dL (ref 0.2–1.2)
Indirect Bilirubin: 0.3 mg/dL (ref 0.2–1.2)
Total Protein: 6.7 g/dL (ref 6.1–8.1)

## 2015-09-29 DIAGNOSIS — H18413 Arcus senilis, bilateral: Secondary | ICD-10-CM | POA: Diagnosis not present

## 2015-10-04 ENCOUNTER — Telehealth: Payer: Self-pay

## 2015-10-04 NOTE — Telephone Encounter (Deleted)
-----   Message from Nunzio Cobbs, MD sent at 09/06/2015 10:04 AM EDT ----- Please let patient know that she is due to a repeat liver function panel.  She came up in my reminder box with EPIC.  Thanks,   Brook  ----- Message -----    From: SYSTEM    Sent: 09/06/2015  12:04 AM      To: Nunzio Cobbs, MD

## 2015-10-04 NOTE — Telephone Encounter (Signed)
Encounter opened in error

## 2015-11-22 DIAGNOSIS — H5213 Myopia, bilateral: Secondary | ICD-10-CM | POA: Diagnosis not present

## 2015-12-15 ENCOUNTER — Encounter: Payer: Self-pay | Admitting: Obstetrics and Gynecology

## 2015-12-15 ENCOUNTER — Telehealth: Payer: Self-pay | Admitting: *Deleted

## 2015-12-15 ENCOUNTER — Ambulatory Visit (INDEPENDENT_AMBULATORY_CARE_PROVIDER_SITE_OTHER): Payer: 59 | Admitting: Obstetrics and Gynecology

## 2015-12-15 VITALS — BP 118/62 | HR 76 | Ht 70.5 in | Wt 155.6 lb

## 2015-12-15 DIAGNOSIS — R5383 Other fatigue: Secondary | ICD-10-CM

## 2015-12-15 DIAGNOSIS — D500 Iron deficiency anemia secondary to blood loss (chronic): Secondary | ICD-10-CM | POA: Diagnosis not present

## 2015-12-15 DIAGNOSIS — N920 Excessive and frequent menstruation with regular cycle: Secondary | ICD-10-CM

## 2015-12-15 DIAGNOSIS — N939 Abnormal uterine and vaginal bleeding, unspecified: Secondary | ICD-10-CM

## 2015-12-15 LAB — CBC
HCT: 41.9 % (ref 35.0–45.0)
HEMOGLOBIN: 13.7 g/dL (ref 11.7–15.5)
MCH: 28.7 pg (ref 27.0–33.0)
MCHC: 32.7 g/dL (ref 32.0–36.0)
MCV: 87.7 fL (ref 80.0–100.0)
MPV: 10.7 fL (ref 7.5–12.5)
Platelets: 228 10*3/uL (ref 140–400)
RBC: 4.78 MIL/uL (ref 3.80–5.10)
RDW: 14 % (ref 11.0–15.0)
WBC: 6.6 10*3/uL (ref 3.8–10.8)

## 2015-12-15 LAB — POCT URINE PREGNANCY: PREG TEST UR: NEGATIVE

## 2015-12-15 LAB — HEMOGLOBIN, FINGERSTICK: HEMOGLOBIN, FINGERSTICK: 13.1 g/dL (ref 12.0–16.0)

## 2015-12-15 NOTE — Telephone Encounter (Signed)
Reviewed with Dr. Quincy Simmonds. Dr. Quincy Simmonds recommends patient be seen in office today for further evaluation. Spoke with patient, advised of recommendations from Dr. Quincy Simmonds. Offered appointment for today at [facility]. Patient is agreeable to date and time, appointment scheduled.   Routing to provider for final review. Patient is agreeable to disposition. Will close encounter.

## 2015-12-15 NOTE — Telephone Encounter (Signed)
Please see telephone encounter from 12/15/2015.

## 2015-12-15 NOTE — Progress Notes (Signed)
GYNECOLOGY  VISIT   HPI: 45 y.o.   Married  Caucasian  female   G2P2004 with Patient's last menstrual period was 12/13/2015 (exact date).   here for heavy menses and fatigue. Patient wants to discuss Mirena IUD.   Had Mirena in past and did well with it.   Menses heavy with clots and feels miserable.  Menses are every 4 weeks.  Can change pad every 1 - 2 hours and be very mindful at night.  Taking iron supplementation and eating iron rich foods.  Notes fatigue of recent onset.   Vasectomy for contraception.  No other partners.   HGB: 13.1 UPT:  Neg  GYNECOLOGIC HISTORY: Patient's last menstrual period was 12/13/2015 (exact date). Contraception:  Vasectomy Menopausal hormone therapy:  n/a Last mammogram:  08-10-15 Density B/Neg/BiRads1:[facility] Last pap smear:  07-01-15 Neg: Neg HR HPV        OB History    Gravida Para Term Preterm AB Living   2 2 2     4    SAB TAB Ectopic Multiple Live Births           2         Patient Active Problem List   Diagnosis Date Noted  . Wrist pain, right 02/03/2014  . Endometrial mass 07/24/2013  . Menorrhagia 06/26/2013    Past Medical History:  Diagnosis Date  . Abnormal Pap smear of cervix   . DVT (deep venous thrombosis) ([place])   . GERD (gastroesophageal reflux disease)   . Melanoma ([place]) 11/2012   left thigh    Past Surgical History:  Procedure Laterality Date  . COLPOSCOPY  05/2010   Mild Dysplasia  . melanoma removal  11/2012   -left thigh  . WRIST FRACTURE SURGERY Left     Current Outpatient Prescriptions  Medication Sig Dispense Refill  . cetirizine (ZYRTEC) 10 MG tablet Take 10 mg by mouth daily.    . Multiple Vitamin (MULTIVITAMIN) tablet Take 1 tablet by mouth daily.    . Probiotic Product (PROBIOTIC PO) Take by mouth.     No current facility-administered medications for this visit.      ALLERGIES: Review of patient's allergies indicates no known allergies.  Family History  Problem Relation Age of  Onset  . Breast cancer Mother   . Hypertension Mother   . Osteoporosis Mother   . Hyperlipidemia Father   . Prostate cancer Father   . Cancer Father     bladder ca  . Heart disease Paternal Grandmother     Social History   Social History  . Marital status: Married    Spouse name: N/A  . Number of children: N/A  . Years of education: N/A   Occupational History  . Not on file.   Social History Main Topics  . Smoking status: Never Smoker  . Smokeless tobacco: Never Used  . Alcohol use 1.2 - 1.8 oz/week    2 - 3 Glasses of wine per week  . Drug use: No  . Sexual activity: Yes    Partners: Male    Birth control/ protection: Other-see comments     Comment: husband w/vasectomy   Other Topics Concern  . Not on file   Social History Narrative  . No narrative on file    ROS:  Pertinent items are noted in HPI.  PHYSICAL EXAMINATION:    BP 118/62 (BP Location: Right Arm, Patient Position: Sitting, Cuff Size: Normal)   Pulse 76   Ht 5' 10.5" (  1.791 m)   Wt 155 lb 9.6 oz (70.6 kg)   LMP 12/13/2015 (Exact Date)   BMI 22.01 kg/m     General appearance: alert, cooperative and appears stated age  Pelvic: External genitalia:  no lesions              Urethra:  normal appearing urethra with no masses, tenderness or lesions              Bartholins and Skenes: normal                 Vagina: normal appearing vagina with normal color and discharge, no lesions.  Menstrual flow noted.               Cervix: no lesions                Bimanual Exam:  Uterus:  normal size, contour, position, consistency, mobility, non-tender              Adnexa: no mass, fullness, tenderness           Chaperone was present for exam.  ASSESSMENT  Menorrhagia.  Hx iron deficiency.  Fatigue. Hx DVT.  Vasectomy for permanent contraception.   PLAN  Check CBC, iron, ferritin, TSH.  Discussion of options for care - Micronor, Mirena, ablation, hysterectomy.  Prefers Mirena IUD.  Ok to insert  when not on menses.    An After Visit Summary was printed and given to the patient.  _15_____ minutes face to face time of which over 50% was spent in counseling.

## 2015-12-15 NOTE — Telephone Encounter (Signed)
Spoke with patient. Patient was last seen in office with Dr. Quincy Simmonds 07/01/15. Patient reports that she started her menses 2 days ago and is changing her tampon every 1-2 hours. Patient states that her cycles come regularly each month and are always heavy. Reports her cycles have not gotten heavier since last visit, but she is experiencing increased fatigue that is affecting her activities of daily living. Patient is interested in having Mirena placed at this time. Patients husband has had a vasectomy. Patient is available to be seen in office this week. Advised I will speak with Dr. Quincy Simmonds and return call with further recommendations.    Dr. Quincy Simmonds-  I hope you are well. I am having heavier and heavier periods and am definitely struggling with the associated fatigue even with iron supplementation. I would like to go ahead and get the Mirena that we discussed at my last visit. Can you please advise me as to my next steps in this process?   Thank you-  Ashley Quinn   Dr. Quincy Simmonds, [place] to proceed with IUD insertion, or would you like to see patient for further evaluation first?

## 2015-12-16 LAB — FERRITIN: FERRITIN: 14 ng/mL (ref 10–232)

## 2015-12-16 LAB — IRON: IRON: 149 ug/dL (ref 40–190)

## 2015-12-16 LAB — TSH: TSH: 0.77 mIU/L

## 2015-12-21 DIAGNOSIS — D2271 Melanocytic nevi of right lower limb, including hip: Secondary | ICD-10-CM | POA: Diagnosis not present

## 2015-12-21 DIAGNOSIS — L814 Other melanin hyperpigmentation: Secondary | ICD-10-CM | POA: Diagnosis not present

## 2015-12-21 DIAGNOSIS — D225 Melanocytic nevi of trunk: Secondary | ICD-10-CM | POA: Diagnosis not present

## 2015-12-21 DIAGNOSIS — L821 Other seborrheic keratosis: Secondary | ICD-10-CM | POA: Diagnosis not present

## 2015-12-21 DIAGNOSIS — Z85820 Personal history of malignant melanoma of skin: Secondary | ICD-10-CM | POA: Diagnosis not present

## 2015-12-21 DIAGNOSIS — D2262 Melanocytic nevi of left upper limb, including shoulder: Secondary | ICD-10-CM | POA: Diagnosis not present

## 2015-12-21 DIAGNOSIS — L245 Irritant contact dermatitis due to other chemical products: Secondary | ICD-10-CM | POA: Diagnosis not present

## 2015-12-29 ENCOUNTER — Telehealth: Payer: Self-pay | Admitting: Obstetrics and Gynecology

## 2015-12-29 NOTE — Telephone Encounter (Signed)
Called patient to review benefits for IUD insertion. Left voicemail to call back and review.

## 2016-03-29 ENCOUNTER — Encounter: Payer: Self-pay | Admitting: Obstetrics and Gynecology

## 2016-03-29 ENCOUNTER — Ambulatory Visit (INDEPENDENT_AMBULATORY_CARE_PROVIDER_SITE_OTHER): Payer: 59 | Admitting: Obstetrics and Gynecology

## 2016-03-29 VITALS — BP 120/62 | HR 66 | Wt 162.8 lb

## 2016-03-29 DIAGNOSIS — N920 Excessive and frequent menstruation with regular cycle: Secondary | ICD-10-CM

## 2016-03-29 DIAGNOSIS — Z30430 Encounter for insertion of intrauterine contraceptive device: Secondary | ICD-10-CM

## 2016-03-29 NOTE — Patient Instructions (Signed)

## 2016-03-29 NOTE — Progress Notes (Signed)
GYNECOLOGY  VISIT   HPI: 45 y.o.   Married  Caucasian  female   G2P2004 with Patient's last menstrual period was 03/25/2016.   here for Mirena IUD insertion.   Has menorrhagia with iron deficiency.  No intermenstrual bleeding.  No history of DVT.  Had superficial thrombosis behind right knee.  Had work up negative for DVT.  Vasectomy for contraception.  Pelvic ultrasound and sononysterogram 07/24/13.  Heart shaped endometrium.  Possible filling defect in the left uterine fundus.  EMB showed secretory endometrium.  Did well with Mirena in the past.   GYNECOLOGIC HISTORY: Patient's last menstrual period was 03/25/2016. Contraception:  Vasectomy Menopausal hormone therapy:  n/a Last mammogram:  08-10-15 Density B/Neg/BiRads1:[facility]  Last pap smear:   07-01-15 Neg: Neg HR HPV         OB History    Gravida Para Term Preterm AB Living   2 2 2     4    SAB TAB Ectopic Multiple Live Births           2         Patient Active Problem List   Diagnosis Date Noted  . Wrist pain, right 02/03/2014  . Endometrial mass 07/24/2013  . Menorrhagia 06/26/2013    Past Medical History:  Diagnosis Date  . Abnormal Pap smear of cervix   . GERD (gastroesophageal reflux disease)   . Melanoma ([place]) 11/2012   left thigh    Past Surgical History:  Procedure Laterality Date  . COLPOSCOPY  05/2010   Mild Dysplasia  . melanoma removal  11/2012   -left thigh  . WRIST FRACTURE SURGERY Left     Current Outpatient Prescriptions  Medication Sig Dispense Refill  . cetirizine (ZYRTEC) 10 MG tablet Take 10 mg by mouth daily.    . Multiple Vitamin (MULTIVITAMIN) tablet Take 1 tablet by mouth daily.    . Probiotic Product (PROBIOTIC PO) Take by mouth.     No current facility-administered medications for this visit.      ALLERGIES: Patient has no known allergies.  Family History  Problem Relation Age of Onset  . Breast cancer Mother   . Hypertension Mother   . Osteoporosis Mother    . Hyperlipidemia Father   . Prostate cancer Father   . Cancer Father     bladder ca  . Heart disease Paternal Grandmother     Social History   Social History  . Marital status: Married    Spouse name: N/A  . Number of children: N/A  . Years of education: N/A   Occupational History  . Not on file.   Social History Main Topics  . Smoking status: Never Smoker  . Smokeless tobacco: Never Used  . Alcohol use 1.2 - 1.8 oz/week    2 - 3 Glasses of wine per week  . Drug use: No  . Sexual activity: Yes    Partners: Male    Birth control/ protection: Other-see comments     Comment: husband w/vasectomy   Other Topics Concern  . Not on file   Social History Narrative  . No narrative on file    ROS:  Pertinent items are noted in HPI.  PHYSICAL EXAMINATION:    BP 120/62 (BP Location: Right Arm, Patient Position: Sitting, Cuff Size: Normal)   Pulse 66   Wt 162 lb 12.8 oz (73.8 kg)   LMP 03/25/2016   BMI 23.03 kg/m     General appearance: alert, cooperative and appears stated  age   Pelvic: External genitalia:  no lesions              Urethra:  normal appearing urethra with no masses, tenderness or lesions              Bartholins and Skenes: normal                 Vagina: normal appearing vagina with normal color and discharge, no lesions              Cervix: no lesions                Bimanual Exam:  Uterus:  normal size, contour, position, consistency, mobility, non-tender, retroverted.              Adnexa: no mass, fullness, tenderness    Procedure - Mirena IUD insertion Consent for procedure - risks, benefits, and alternatives reviewed. Mirena IUD insertion - lot number [id number], exp 04/20 Speculum placed.  Cervix sterilized with Hibiclens. Paracervical block with 10 cc 1% Lidocaine - lot number [id number], exp 2/21  Uterus sounded to 8 cm after os finder used.  Mirena placed without difficulty.  Strings trimmed.  No complications.  Minimal EBL.  Chaperone was  present for exam.  ASSESSMENT  Mirena IUD insertion.  Heart shaped endometrial cavity - arcuate. Hx prior Mirena IUD use.   Did well.  PLAN  Counseling regarding alternatives to Mirena IUD - progesterone contraceptives, endometrial ablation.  Prior pelvic ultrasound reviewed. Instructions and precautions given.  Follow up in one month.    An After Visit Summary was printed and given to the patient.  __15____ minutes face to face time of which over 50% was spent in counseling.

## 2016-04-27 ENCOUNTER — Ambulatory Visit: Payer: 59 | Admitting: Obstetrics and Gynecology

## 2016-05-01 ENCOUNTER — Ambulatory Visit (INDEPENDENT_AMBULATORY_CARE_PROVIDER_SITE_OTHER): Payer: 59 | Admitting: Obstetrics and Gynecology

## 2016-05-01 ENCOUNTER — Encounter: Payer: Self-pay | Admitting: Obstetrics and Gynecology

## 2016-05-01 VITALS — BP 118/68 | HR 76 | Ht 70.5 in | Wt 154.2 lb

## 2016-05-01 DIAGNOSIS — Z30431 Encounter for routine checking of intrauterine contraceptive device: Secondary | ICD-10-CM

## 2016-05-01 MED ORDER — ESTRADIOL 0.0375 MG/24HR TD PTTW: MEDICATED_PATCH | TRANSDERMAL | 2 refills | Status: DC

## 2016-05-01 NOTE — Progress Notes (Signed)
GYNECOLOGY  VISIT   HPI: 46 y.o.   Married  Caucasian  female   G2P2004 with No LMP recorded.   here for follow up after Mirena IUD insertion. Patient states has been bleeding everyday since insertion of IUD;either dark brown or red blood. Wore a tampon for 2 - 3 days only once.  The bleeding is only minipad use with changes a few times a day.   Mirena inserted on 03/29/16.  Used combined oral contraception in the past.   Hx superficial phlebitis during pregnancy in April 2003.  Tx with warm compresses. No Hx DVT.  Uses compression stockings currently due to varicose veins. Minivelle  GYNECOLOGIC HISTORY: No LMP recorded. Contraception:  Vasectomy/Mirena IUD inserted 03-29-16 Menopausal hormone therapy:  n/a Last mammogram:  08-10-15 Density B/Neg/BiRads1:[facility]  Last pap smear:  07-01-15 Neg: Neg HR HPV          OB History    Gravida Para Term Preterm AB Living   2 2 2     4    SAB TAB Ectopic Multiple Live Births           2         Patient Active Problem List   Diagnosis Date Noted  . Wrist pain, right 02/03/2014  . Endometrial mass 07/24/2013  . Menorrhagia 06/26/2013    Past Medical History:  Diagnosis Date  . Abnormal Pap smear of cervix   . GERD (gastroesophageal reflux disease)   . Melanoma ([place]) 11/2012   left thigh    Past Surgical History:  Procedure Laterality Date  . COLPOSCOPY  05/2010   Mild Dysplasia  . melanoma removal  11/2012   -left thigh  . WRIST FRACTURE SURGERY Left     Current Outpatient Prescriptions  Medication Sig Dispense Refill  . cetirizine (ZYRTEC) 10 MG tablet Take 10 mg by mouth daily.    Marland Kitchen levonorgestrel (MIRENA) 20 MCG/24HR IUD 1 each by Intrauterine route once.    . Multiple Vitamin (MULTIVITAMIN) tablet Take 1 tablet by mouth daily.    . Probiotic Product (PROBIOTIC PO) Take by mouth.     No current facility-administered medications for this visit.      ALLERGIES: Patient has no known allergies.  Family  History  Problem Relation Age of Onset  . Breast cancer Mother   . Hypertension Mother   . Osteoporosis Mother   . Hyperlipidemia Father   . Prostate cancer Father   . Cancer Father     bladder ca  . Heart disease Paternal Grandmother     Social History   Social History  . Marital status: Married    Spouse name: N/A  . Number of children: N/A  . Years of education: N/A   Occupational History  . Not on file.   Social History Main Topics  . Smoking status: Never Smoker  . Smokeless tobacco: Never Used  . Alcohol use 1.2 - 1.8 oz/week    2 - 3 Glasses of wine per week  . Drug use: No  . Sexual activity: Yes    Partners: Male    Birth control/ protection: Other-see comments     Comment: husband w/vasectomy/Mirena IUD inserted 03-29-16   Other Topics Concern  . Not on file   Social History Narrative  . No narrative on file    ROS:  Pertinent items are noted in HPI.  PHYSICAL EXAMINATION:    BP 118/68 (BP Location: Right Arm, Patient Position: Sitting, Cuff Size: Normal)  Pulse 76   Ht 5' 10.5" (1.791 m)   Wt 154 lb 3.2 oz (69.9 kg)   BMI 21.81 kg/m     General appearance: alert, cooperative and appears stated age   Pelvic: External genitalia:  no lesions              Urethra:  normal appearing urethra with no masses, tenderness or lesions              Bartholins and Skenes: normal                 Vagina: normal appearing vagina with brown blood noted.              Cervix: no lesions.  IUD strings present.  Trimmed.                 Bimanual Exam:  Uterus:  normal size, contour, position, consistency, mobility, non-tender              Adnexa: no mass, fullness, tenderness        Chaperone was present for exam.  ASSESSMENT  Mirena IUD check up.  Irregular bleeding.  Hx superficial phlebitis.  PLAN  Discussion of expected bleeding profile on Mirena.  Discussed risks and benefits of adding estrogen therapy.  Will add Minivelle 0.0375 mg twice weekly  for 3 months.  I discussed risk of DVT and PE.  Follow up prn.   An After Visit Summary was printed and given to the patient.  __15____ minutes face to face time of which over 50% was spent in counseling.

## 2016-05-11 MED FILL — ESTRADIOL PATCH 0.0375: 0.0375 | 28 days supply | Qty: 8 | Fill #0

## 2016-06-01 ENCOUNTER — Encounter: Payer: Self-pay | Admitting: Family

## 2016-06-01 ENCOUNTER — Ambulatory Visit (INDEPENDENT_AMBULATORY_CARE_PROVIDER_SITE_OTHER): Payer: Self-pay | Admitting: Family

## 2016-06-01 VITALS — BP 102/70 | HR 84 | Temp 98.4°F | Wt 157.0 lb

## 2016-06-01 DIAGNOSIS — L309 Dermatitis, unspecified: Secondary | ICD-10-CM | POA: Insufficient documentation

## 2016-06-01 MED ORDER — PREDNISONE 5 MG PO TABS: 5.0000 mg | ORAL_TABLET | ORAL | 0 refills | Status: DC

## 2016-06-01 MED FILL — predniSONE 5 MG TABS: 5 | 6 days supply | Qty: 21 | Fill #0

## 2016-06-01 NOTE — Progress Notes (Signed)
Ashley Quinn  Chief Complaint  Patient presents with  . right eye rash      ICD-9-CM ICD-10-CM   1. Dermatitis 692.9 L30.9 predniSONE (DELTASONE) 5 MG tablet    Patient Active Problem List   Diagnosis Date Noted  . Dermatitis 06/01/2016  . Wrist pain, right 02/03/2014  . Endometrial mass 07/24/2013  . Menorrhagia 06/26/2013    Past Medical History:  Diagnosis Date  . Abnormal Pap smear of cervix   . GERD (gastroesophageal reflux disease)   . Melanoma ([place]) 11/2012   left thigh    Past Surgical History:  Procedure Laterality Date  . COLPOSCOPY  05/2010   Mild Dysplasia  . melanoma removal  11/2012   -left thigh  . WRIST FRACTURE SURGERY Left     No Known Allergies  BP 102/70 (BP Location: Right Arm, Patient Position: Sitting, Cuff Size: Normal)   Pulse 84   Temp 98.4 F (36.9 C) (Oral)   Wt 157 lb (71.2 kg)   SpO2 98%   BMI 22.21 kg/m   Review of Systems  Skin: Positive for rash.  All other systems reviewed and are negative.  Physical Exam  Constitutional: She is oriented to person, place, and time. She appears well-developed and well-nourished.  HENT:  Head: Normocephalic.  Eyes: Conjunctivae are normal.  Neck: Normal range of motion.  Cardiovascular: Normal rate, regular rhythm and normal heart sounds.   Pulmonary/Chest: Effort normal and breath sounds normal.  Abdominal: Soft. Bowel sounds are normal.  Musculoskeletal: Normal range of motion.  Neurological: She is alert and oriented to person, place, and time.  Skin: Skin is warm, dry and intact.       No results found for this or any previous visit (from the past 72 hour(s)).   Current Outpatient Prescriptions:  .  cetirizine (ZYRTEC) 10 MG tablet, Take 10 mg by mouth daily., Disp: , Rfl:  .  estradiol (MINIVELLE) 0.0375 MG/24HR, Apply one patch (0.0375 mg) to clean and dry skin twice weekly., Disp: 8 patch, Rfl: 2 .  levonorgestrel (MIRENA) 20 MCG/24HR IUD, 1 each by Intrauterine  route once., Disp: , Rfl:  .  Multiple Vitamin (MULTIVITAMIN) tablet, Take 1 tablet by mouth daily., Disp: , Rfl:  .  Probiotic Product (PROBIOTIC PO), Take by mouth., Disp: , Rfl:  .  predniSONE (DELTASONE) 5 MG tablet, Take 1 tablet (5 mg total) by mouth as directed. 21 dose taper per pharmacy, Disp: 21 tablet, Rfl: 0  Subjective: "I have had a rash for a few weeks on my face near my eye and cheek since mid January. We used Tide to wash some clothes and we normally use all natural detergents. I had a breakout all over and I rewashed everything a lot since then but the rash keeps coming and going."  Objective: Pt seen and chart reviewed. Pt is alert/oriented x4, calm, and in NAD. Pt has a visible rash laterally to the right eye and also 2 very faint patches of discoloration (pt states this looks worse without makeup on) on her cheek.   Assessment: Dermatitis (contact vs. Internal allergic reaction)  Plan:  -Continue home hydrocortisone cream (helping but minimally) -Sterapred (generic) 5mg  21 dose pack taper  Benjamine Mola, FNP 06/01/2016 8:35 AM

## 2016-06-08 ENCOUNTER — Telehealth: Payer: Self-pay | Admitting: *Deleted

## 2016-06-08 DIAGNOSIS — L309 Dermatitis, unspecified: Secondary | ICD-10-CM

## 2016-06-08 NOTE — Telephone Encounter (Signed)
Patient called because her skin rash has returned.  She would like to know if her prescription for prednisone can be increased?

## 2016-06-09 MED ORDER — PREDNISONE 5 MG PO TABS: 5.0000 mg | ORAL_TABLET | ORAL | 0 refills | Status: DC

## 2016-06-09 MED FILL — predniSONE 5 MG TABS: 5 | 6 days supply | Qty: 21 | Fill #0

## 2016-06-13 DIAGNOSIS — I8311 Varicose veins of right lower extremity with inflammation: Secondary | ICD-10-CM | POA: Diagnosis not present

## 2016-06-13 DIAGNOSIS — I8312 Varicose veins of left lower extremity with inflammation: Secondary | ICD-10-CM | POA: Diagnosis not present

## 2016-06-19 ENCOUNTER — Encounter: Payer: Self-pay | Admitting: Obstetrics and Gynecology

## 2016-07-21 ENCOUNTER — Ambulatory Visit: Payer: 59 | Admitting: Obstetrics and Gynecology

## 2016-07-25 DIAGNOSIS — I8311 Varicose veins of right lower extremity with inflammation: Secondary | ICD-10-CM | POA: Diagnosis not present

## 2016-07-25 HISTORY — PX: ENDOSCOPIC VEIN LASER TREATMENT: SHX1508

## 2016-07-27 DIAGNOSIS — I8311 Varicose veins of right lower extremity with inflammation: Secondary | ICD-10-CM | POA: Diagnosis not present

## 2016-08-03 ENCOUNTER — Encounter: Payer: Self-pay | Admitting: Obstetrics and Gynecology

## 2016-08-03 ENCOUNTER — Ambulatory Visit (INDEPENDENT_AMBULATORY_CARE_PROVIDER_SITE_OTHER): Payer: 59 | Admitting: Obstetrics and Gynecology

## 2016-08-03 VITALS — BP 100/60 | HR 66 | Resp 18 | Ht 69.0 in | Wt 157.2 lb

## 2016-08-03 DIAGNOSIS — N921 Excessive and frequent menstruation with irregular cycle: Secondary | ICD-10-CM

## 2016-08-03 DIAGNOSIS — Z01419 Encounter for gynecological examination (general) (routine) without abnormal findings: Secondary | ICD-10-CM

## 2016-08-03 NOTE — Patient Instructions (Addendum)
Endometrial Ablation Endometrial ablation is a procedure that destroys the thin inner layer of the lining of the uterus (endometrium). This procedure may be done:  To stop heavy periods.  To stop bleeding that is causing anemia.  To control irregular bleeding.  To treat bleeding caused by small tumors (fibroids) in the endometrium.  This procedure is often an alternative to major surgery, such as removal of the uterus and cervix (hysterectomy). As a result of this procedure:  You may not be able to have children. However, if you are premenopausal (you have not gone through menopause): ? You may still have a small chance of getting pregnant. ? You will need to use a reliable method of birth control after the procedure to prevent pregnancy.  You may stop having a menstrual period, or you may have only a small amount of bleeding during your period. Menstruation may return several years after the procedure.  Tell a health care provider about:  Any allergies you have.  All medicines you are taking, including vitamins, herbs, eye drops, creams, and over-the-counter medicines.  Any problems you or family members have had with the use of anesthetic medicines.  Any blood disorders you have.  Any surgeries you have had.  Any medical conditions you have. What are the risks? Generally, this is a safe procedure. However, problems may occur, including:  A hole (perforation) in the uterus or bowel.  Infection of the uterus, bladder, or vagina.  Bleeding.  Damage to other structures or organs.  An air bubble in the lung (air embolus).  Problems with pregnancy after the procedure.  Failure of the procedure.  Decreased ability to diagnose cancer in the endometrium.  What happens before the procedure?  You will have tests of your endometrium to make sure there are no pre-cancerous cells or cancer cells present.  You may have an ultrasound of the uterus.  You may be given  medicines to thin the endometrium.  Ask your health care provider about: ? Changing or stopping your regular medicines. This is especially important if you take diabetes medicines or blood thinners. ? Taking medicines such as aspirin and ibuprofen. These medicines can thin your blood. Do not take these medicines before your procedure if your doctor tells you not to.  Plan to have someone take you home from the hospital or clinic. What happens during the procedure?  You will lie on an exam table with your feet and legs supported as in a pelvic exam.  To lower your risk of infection: ? Your health care team will wash or sanitize their hands and put on germ-free (sterile) gloves. ? Your genital area will be washed with soap.  An IV tube will be inserted into one of your veins.  You will be given a medicine to help you relax (sedative).  A surgical instrument with a light and camera (resectoscope) will be inserted into your vagina and moved into your uterus. This allows your surgeon to see inside your uterus.  Endometrial tissue will be removed using one of the following methods: ? Radiofrequency. This method uses a radiofrequency-alternating electric current to remove the endometrium. ? Cryotherapy. This method uses extreme cold to freeze the endometrium. ? Heated-free liquid. This method uses a heated saltwater (saline) solution to remove the endometrium. ? Microwave. This method uses high-energy microwaves to heat up the endometrium and remove it. ? Thermal balloon. This method involves inserting a catheter with a balloon tip into the uterus. The balloon tip is   fluid to remove the endometrium. The procedure may vary among health care providers and hospitals. What happens after the procedure?  Your blood pressure, heart rate, breathing rate, and blood oxygen level will be monitored until the medicines you were given have worn off.  As tissue healing occurs, you may notice vaginal  bleeding for 4-6 weeks after the procedure. You may also experience:  Cramps.  Thin, watery vaginal discharge that is light pink or brown in color.  A need to urinate more frequently than usual.  Nausea.  Do not drive for 24 hours if you were given a sedative.  Do not have sex or insert anything into your vagina until your health care provider approves. Summary  Endometrial ablation is done to treat the many causes of heavy menstrual bleeding.  The procedure may be done only after medications have been tried to control the bleeding.  Plan to have someone take you home from the hospital or clinic. This information is not intended to replace advice given to you by your health care provider. Make sure you discuss any questions you have with your health care provider. Document Released: 02/04/2004 Document Revised: 04/13/2016 Document Reviewed: 04/13/2016 Elsevier Interactive Patient Education  2017 [employer].  The most common method for ablation is the NovaSure.  EXERCISE AND DIET:  We recommended that you start or continue a regular exercise program for good health. Regular exercise means any activity that makes your heart beat faster and makes you sweat.  We recommend exercising at least 30 minutes per day at least 3 days a week, preferably 4 or 5.  We also recommend a diet low in fat and sugar.  Inactivity, poor dietary choices and obesity can cause diabetes, heart attack, stroke, and kidney damage, among others.    ALCOHOL AND SMOKING:  Women should limit their alcohol intake to no more than 7 drinks/beers/glasses of wine (combined, not each!) per week. Moderation of alcohol intake to this level decreases your risk of breast cancer and liver damage. And of course, no recreational drugs are part of a healthy lifestyle.  And absolutely no smoking or even second hand smoke. Most people know smoking can cause heart and lung diseases, but did you know it also contributes to weakening of  your bones? Aging of your skin?  Yellowing of your teeth and nails?  CALCIUM AND VITAMIN D:  Adequate intake of calcium and Vitamin D are recommended.  The recommendations for exact amounts of these supplements seem to change often, but generally speaking 600 mg of calcium (either carbonate or citrate) and 800 units of Vitamin D per day seems prudent. Certain women may benefit from higher intake of Vitamin D.  If you are among these women, your doctor will have told you during your visit.    PAP SMEARS:  Pap smears, to check for cervical cancer or precancers,  have traditionally been done yearly, although recent scientific advances have shown that most women can have pap smears less often.  However, every woman still should have a physical exam from her gynecologist every year. It will include a breast check, inspection of the vulva and vagina to check for abnormal growths or skin changes, a visual exam of the cervix, and then an exam to evaluate the size and shape of the uterus and ovaries.  And after 47 years of age, a rectal exam is indicated to check for rectal cancers. We will also provide age appropriate advice regarding health maintenance, like when you should  have certain vaccines, screening for sexually transmitted diseases, bone density testing, colonoscopy, mammograms, etc.   MAMMOGRAMS:  All women over 62 years old should have a yearly mammogram. Many facilities now offer a "3D" mammogram, which may cost around $50 extra out of pocket. If possible,  we recommend you accept the option to have the 3D mammogram performed.  It both reduces the number of women who will be called back for extra views which then turn out to be normal, and it is better than the routine mammogram at detecting truly abnormal areas.    COLONOSCOPY:  Colonoscopy to screen for colon cancer is recommended for all women at age 44.  We know, you hate the idea of the prep.  We agree, BUT, having colon cancer and not knowing it is  worse!!  Colon cancer so often starts as a polyp that can be seen and removed at colonscopy, which can quite literally save your life!  And if your first colonoscopy is normal and you have no family history of colon cancer, most women don't have to have it again for 10 years.  Once every ten years, you can do something that may end up saving your life, right?  We will be happy to help you get it scheduled when you are ready.  Be sure to check your insurance coverage so you understand how much it will cost.  It may be covered as a preventative service at no cost, but you should check your particular policy.

## 2016-08-03 NOTE — Progress Notes (Signed)
46 y.o. G66P2004 Married Caucasian female here for annual exam.    Patient complaining of acne since Mirena IUD insertion 03/2016. Having cystic acne.  Only three days total of no bleeding.  Minivelle did not help. Wants a solution for these issues.  Sonohysterogram on 07/24/13 showing possible ffilling defect.  EMB was benign early secretory endometrium.  Menses prior to IUD placement in 03/2016 were heavy but regular.   Did not like taking combined birth control in past and was not tolerating it well.  This prompted vasectomy in her husband.   Hx varicose veins.  Uses compression stocking on right lower extremity.   Off Prilosec for one year.  Using apple cider vinegar.   PCP:  none  Patient's last menstrual period was 08/03/2016 (exact date).     Period Cycle (Days):  (Mirena IUD) Period Pattern: (!) Irregular     Sexually active: Yes.   female The current method of family planning is vasectomy/Mirena IUD inserted 03-29-16.    Exercising: Yes.     Smoker:  no  Health Maintenance: Pap: 07-01-15 Neg:Neg HR HPV; 06-30-14 Neg  History of abnormal Pap:  Yes, 2002 AGUS Pap; 05/26/10 LGSIL colpo bx done atypia with HPV effect. MMG: 08-09-15 Density B/Neg/BiRads1:TBC Colonoscopy:  n/a BMD:   n/a  Result  n/a TDaP:  Up to date--through employer/[employer] Gardasil:   no HIV:  In pregnancy.  Hep C:Unsure Screening Labs:  Hb today: wants to return for fasting labs, Urine today: not done   reports that she has never smoked. She has never used smokeless tobacco. She reports that she drinks about 1.8 - 2.4 oz of alcohol per week . She reports that she does not use drugs.  Past Medical History:  Diagnosis Date  . Abnormal Pap smear of cervix   . GERD (gastroesophageal reflux disease)   . Melanoma ([place]) 11/2012   left thigh    Past Surgical History:  Procedure Laterality Date  . COLPOSCOPY  05/2010   Mild Dysplasia  . ENDOSCOPIC VEIN LASER TREATMENT Right 07/25/2016  . melanoma  removal  11/2012   -left thigh  . WRIST FRACTURE SURGERY Left     Current Outpatient Prescriptions  Medication Sig Dispense Refill  . cetirizine (ZYRTEC) 10 MG tablet Take 10 mg by mouth daily.    Marland Kitchen levonorgestrel (MIRENA) 20 MCG/24HR IUD 1 each by Intrauterine route once.    . Multiple Vitamin (MULTIVITAMIN) tablet Take 1 tablet by mouth daily.    . Probiotic Product (PROBIOTIC PO) Take by mouth.    . estradiol (MINIVELLE) 0.0375 MG/24HR Apply one patch (0.0375 mg) to clean and dry skin twice weekly. (Patient not taking: Reported on 08/03/2016) 8 patch 2   No current facility-administered medications for this visit.     Family History  Problem Relation Age of Onset  . Breast cancer Mother   . Hypertension Mother   . Osteoporosis Mother   . Hyperlipidemia Father   . Prostate cancer Father   . Cancer Father     bladder ca  . Heart disease Paternal Grandmother     ROS:  Pertinent items are noted in HPI.  Otherwise, a comprehensive ROS was negative.  Exam:   BP 100/60 (BP Location: Right Arm, Patient Position: Sitting, Cuff Size: Normal)   Pulse 66   Resp 18   Ht 5\' 9"  (1.753 m)   Wt 157 lb 3.2 oz (71.3 kg)   LMP 08/03/2016 (Exact Date)   BMI 23.21 kg/m  General appearance: alert, cooperative and appears stated age Head: Normocephalic, without obvious abnormality, atraumatic Neck: no adenopathy, supple, symmetrical, trachea midline and thyroid normal to inspection and palpation Lungs: clear to auscultation bilaterally Breasts: normal appearance, no masses or tenderness, No nipple retraction or dimpling, No nipple discharge or bleeding, No axillary or supraclavicular adenopathy Heart: regular rate and rhythm Abdomen: soft, non-tender; no masses, no organomegaly Extremities: extremities normal, atraumatic, no cyanosis or edema Skin: Skin color, texture, turgor normal. No rashes or lesions Lymph nodes: Cervical, supraclavicular, and axillary nodes normal. No abnormal  inguinal nodes palpated Neurologic: Grossly normal  Pelvic: External genitalia:  no lesions              Urethra:  normal appearing urethra with no masses, tenderness or lesions              Bartholins and Skenes: normal                 Vagina: normal appearing vagina with normal color and discharge, no lesions.  Menstrual flow today.              Cervix: no lesions.  IUD strings noted.              Pap taken: No. Bimanual Exam:  Uterus:  normal size, contour, position, consistency, mobility, non-tender              Adnexa: no mass, fullness, tenderness              Rectal exam: Yes.  .  Confirms.              Anus:  normal sphincter tone, no lesions  Chaperone was present for exam.  Assessment:   Well woman visit with normal exam. Remote hx of AGUS pap and LGSIL. Mirena IUD.  Metrorrhagia.  Hx menorrhagia prior to IUD.  FH of breast cancer in mother.  Varicose veins.  Does not have a history of DVT. Hx melanoma.  Plan: Mammogram screening discussed.  She will do regular and not 3D. Recommended self breast awareness. Pap and HR HPV as above. Guidelines for Calcium, Vitamin D, regular exercise program including cardiovascular and weight bearing exercise. We discussed options for care including: - ultra low dose combined oral contraception and the risks associated with this. - hysteroscopy with dilation and curettage and endometrial ablation with laparoscopic bilateral salpingectomy. - hysterectomy with bilateral salpingectomy.  Patient is interested in endometrial ablation and we will have her return for IUD removal, pelvic ultrasound and possible sonohysterogram with endometrial biopsy in preparation for this. She will return for fasting labs.  Follow up annually and prn.       After visit summary provided.

## 2016-08-10 ENCOUNTER — Other Ambulatory Visit: Payer: 59

## 2016-08-10 DIAGNOSIS — Z01419 Encounter for gynecological examination (general) (routine) without abnormal findings: Secondary | ICD-10-CM

## 2016-08-10 LAB — LIPID PANEL
CHOL/HDL RATIO: 2.1 ratio (ref <5.0)
CHOLESTEROL: 163 mg/dL (ref <200)
HDL: 79 mg/dL (ref >50)
LDL Cholesterol: 77 mg/dL (ref <100)
TRIGLYCERIDES: 36 mg/dL (ref <150)
VLDL: 7 mg/dL (ref <30)

## 2016-08-10 LAB — COMPREHENSIVE METABOLIC PANEL
ALT: 12 U/L (ref 6–29)
AST: 16 U/L (ref 10–35)
Albumin: 4 g/dL (ref 3.6–5.1)
Alkaline Phosphatase: 53 U/L (ref 33–115)
BUN: 16 mg/dL (ref 7–25)
CALCIUM: 9.1 mg/dL (ref 8.6–10.2)
CHLORIDE: 104 mmol/L (ref 98–110)
CO2: 27 mmol/L (ref 20–31)
Creat: 0.67 mg/dL (ref 0.50–1.10)
GLUCOSE: 67 mg/dL (ref 65–99)
POTASSIUM: 4.6 mmol/L (ref 3.5–5.3)
Sodium: 139 mmol/L (ref 135–146)
Total Bilirubin: 0.6 mg/dL (ref 0.2–1.2)
Total Protein: 6.5 g/dL (ref 6.1–8.1)

## 2016-08-10 LAB — CBC
HEMATOCRIT: 43.9 % (ref 35.0–45.0)
Hemoglobin: 14.6 g/dL (ref 11.7–15.5)
MCH: 29.7 pg (ref 27.0–33.0)
MCHC: 33.3 g/dL (ref 32.0–36.0)
MCV: 89.2 fL (ref 80.0–100.0)
MPV: 10.8 fL (ref 7.5–12.5)
PLATELETS: 192 10*3/uL (ref 140–400)
RBC: 4.92 MIL/uL (ref 3.80–5.10)
RDW: 13.5 % (ref 11.0–15.0)
WBC: 6 10*3/uL (ref 3.8–10.8)

## 2016-08-10 LAB — TSH: TSH: 0.78 mIU/L

## 2016-08-11 ENCOUNTER — Telehealth: Payer: Self-pay | Admitting: Obstetrics and Gynecology

## 2016-08-11 LAB — VITAMIN D 25 HYDROXY (VIT D DEFICIENCY, FRACTURES): Vit D, 25-Hydroxy: 35 ng/mL (ref 30–100)

## 2016-08-11 NOTE — Telephone Encounter (Signed)
Called patient to review benefits for a recommended sonohysterogram,endometrial biopsy, IUD removal and surgical benefits. Left Voicemail requesting a call back.  CC: Ashley Quinn

## 2016-08-15 NOTE — Telephone Encounter (Signed)
Ok to close

## 2016-08-15 NOTE — Telephone Encounter (Signed)
Thank you for the update!

## 2016-08-15 NOTE — Telephone Encounter (Signed)
Spoke with patient regarding benefit recommended procedures. Patient understood and agreeable. Patient ready to schedule. Patient scheduled 08/24/16 with Dr Quincy Simmonds. Patient aware of date and time. No further questions.   Routing to Dr Quincy Simmonds for review

## 2016-08-18 DIAGNOSIS — I8311 Varicose veins of right lower extremity with inflammation: Secondary | ICD-10-CM | POA: Diagnosis not present

## 2016-08-21 DIAGNOSIS — H35361 Drusen (degenerative) of macula, right eye: Secondary | ICD-10-CM | POA: Diagnosis not present

## 2016-08-21 DIAGNOSIS — H25013 Cortical age-related cataract, bilateral: Secondary | ICD-10-CM | POA: Diagnosis not present

## 2016-08-21 DIAGNOSIS — H2513 Age-related nuclear cataract, bilateral: Secondary | ICD-10-CM | POA: Diagnosis not present

## 2016-08-21 DIAGNOSIS — H35439 Paving stone degeneration of retina, unspecified eye: Secondary | ICD-10-CM | POA: Diagnosis not present

## 2016-08-21 DIAGNOSIS — H40013 Open angle with borderline findings, low risk, bilateral: Secondary | ICD-10-CM | POA: Diagnosis not present

## 2016-08-21 MED FILL — RESTASIS MULTIDOSE 0.05% EY: 0.05 | 30 days supply | Qty: 6 | Fill #0

## 2016-08-24 ENCOUNTER — Ambulatory Visit (INDEPENDENT_AMBULATORY_CARE_PROVIDER_SITE_OTHER): Payer: 59 | Admitting: Obstetrics and Gynecology

## 2016-08-24 ENCOUNTER — Other Ambulatory Visit: Payer: Self-pay | Admitting: Obstetrics and Gynecology

## 2016-08-24 ENCOUNTER — Encounter: Payer: Self-pay | Admitting: Obstetrics and Gynecology

## 2016-08-24 ENCOUNTER — Ambulatory Visit (INDEPENDENT_AMBULATORY_CARE_PROVIDER_SITE_OTHER): Payer: 59

## 2016-08-24 DIAGNOSIS — N921 Excessive and frequent menstruation with irregular cycle: Secondary | ICD-10-CM

## 2016-08-24 DIAGNOSIS — Z30432 Encounter for removal of intrauterine contraceptive device: Secondary | ICD-10-CM

## 2016-08-24 DIAGNOSIS — N710 Acute inflammatory disease of uterus: Secondary | ICD-10-CM | POA: Diagnosis not present

## 2016-08-24 NOTE — Progress Notes (Signed)
Encounter reviewed by Dr. Renelle Stegenga Amundson C. Silva.  

## 2016-08-24 NOTE — Patient Instructions (Signed)

## 2016-08-24 NOTE — Progress Notes (Signed)
Patient ID: Ashley Quinn, female   DOB: 03-18-1971, 46 y.o.   MRN: [id number] GYNECOLOGY  VISIT   HPI: 46 y.o.   Married  Caucasian  female   G2P2004 with Patient's last menstrual period was 08/03/2016 (exact date).   here for pelvic ultrasound for metrorrhagia.   Patient has irregular bleeding and cystic acne with Mirena IUD.  Minivelle did not help with cycle control. Menses prior to IUD were heavy but regular.  Desires endometrial ablation and potential bilateral salpingectomy.   GYNECOLOGIC HISTORY: Patient's last menstrual period was 08/03/2016 (exact date). Contraception: Vasectomy/Mirena IUD 03-29-16 - removed today Menopausal hormone therapy:  n/a Last mammogram: 08-09-15 Density B/Neg/BiRads1:TBC Last pap smear:07-01-15 Neg:Neg HR HPV; 06-30-14 Neg          OB History    Gravida Para Term Preterm AB Living   2 2 2     4    SAB TAB Ectopic Multiple Live Births           2         Patient Active Problem List   Diagnosis Date Noted  . Dermatitis 06/01/2016  . Wrist pain, right 02/03/2014  . Endometrial mass 07/24/2013  . Menorrhagia 06/26/2013    Past Medical History:  Diagnosis Date  . Abnormal Pap smear of cervix   . GERD (gastroesophageal reflux disease)   . Melanoma ([place]) 11/2012   left thigh    Past Surgical History:  Procedure Laterality Date  . COLPOSCOPY  05/2010   Mild Dysplasia  . ENDOSCOPIC VEIN LASER TREATMENT Right 07/25/2016  . melanoma removal  11/2012   -left thigh  . WRIST FRACTURE SURGERY Left     Current Outpatient Prescriptions  Medication Sig Dispense Refill  . cetirizine (ZYRTEC) 10 MG tablet Take 10 mg by mouth daily.    . Multiple Vitamin (MULTIVITAMIN) tablet Take 1 tablet by mouth daily.    . Probiotic Product (PROBIOTIC PO) Take by mouth.    . RESTASIS MULTIDOSE 0.05 % ophthalmic emulsion      No current facility-administered medications for this visit.      ALLERGIES: Patient has no known allergies.  Family History   Problem Relation Age of Onset  . Breast cancer Mother   . Hypertension Mother   . Osteoporosis Mother   . Hyperlipidemia Father   . Prostate cancer Father   . Cancer Father        bladder ca  . Heart disease Paternal Grandmother     Social History   Social History  . Marital status: Married    Spouse name: N/A  . Number of children: N/A  . Years of education: N/A   Occupational History  . Not on file.   Social History Main Topics  . Smoking status: Never Smoker  . Smokeless tobacco: Never Used  . Alcohol use 1.8 - 2.4 oz/week    3 - 4 Glasses of wine per week  . Drug use: No  . Sexual activity: Yes    Partners: Male    Birth control/ protection: Other-see comments     Comment: husband w/vasectomy/Mirena IUD inserted 03-29-16   Other Topics Concern  . Not on file   Social History Narrative  . No narrative on file    ROS:  Pertinent items are noted in HPI.  PHYSICAL EXAMINATION:    BP 108/60 (BP Location: Right Arm, Patient Position: Sitting, Cuff Size: Normal)   Pulse 72   Resp 16   Ht 5' 10.5" (1.791  m)   Wt 155 lb (70.3 kg)   LMP 08/03/2016 (Exact Date)   BMI 21.93 kg/m     General appearance: alert, cooperative and appears stated age   Pelvic ultrasound: No uterine masses.  EMS 2.14 mm.  IUD in canal.  Bilateral ovaries with follicles. No free fluid.  Sonohysterogram: Sterile prep with Hibiclens.  IUD string noted and IUD removed intact with ring forceps, shown to patient and discarded. Canula placed in uterine cavity and NS fluid injected.   No definite filling defect noted.   EMB:   Sterile prep with Hibiclens. Uterus sounded to 8 cm. Pipelle passed x 2.  Tissue to pathology.  No complications.  Minimal EBL.  Chaperone was present for exam.  ASSESSMENT  Vasectomy for contraception.  Menorrhagia prior to IUD.  Metrorrhagia with IUD.  Mirena removed today.   PLAN  Follow up EMB. Discussion of hysteroscopy with dilation and  curettage and Novasure endometrial ablation combined with potential laparoscopic bilateral salpingectomy.  We reviewed risks, benefits, and alternatives to this surgery.  Risks may include but are not limited to bleeding, infection, damage to surrounding or organs including uterine perforation, pulmonary edema, uterine scarring making future evaluation of abnormal bleeding difficult or not possible except through hysterectomy, DVT, PE, and death.  Patient is considering whether or not she would like to proceed with the bilateral salpingectomy as her husband has had a vasectomy.   An After Visit Summary was printed and given to the patient.  _25_____ minutes face to face time of which over 50% was spent in counseling.

## 2016-08-28 LAB — IPS OTHER TISSUE BIOPSY

## 2016-10-02 ENCOUNTER — Other Ambulatory Visit: Payer: Self-pay | Admitting: Obstetrics and Gynecology

## 2016-10-02 DIAGNOSIS — Z1231 Encounter for screening mammogram for malignant neoplasm of breast: Secondary | ICD-10-CM

## 2016-10-05 ENCOUNTER — Ambulatory Visit
Admission: RE | Admit: 2016-10-05 | Discharge: 2016-10-05 | Disposition: A | Discharge status: Home / Self Care | Payer: 59 | Source: Ambulatory Visit | Attending: Obstetrics and Gynecology | Admitting: Obstetrics and Gynecology

## 2016-10-05 DIAGNOSIS — Z1231 Encounter for screening mammogram for malignant neoplasm of breast: Secondary | ICD-10-CM

## 2016-10-24 DIAGNOSIS — I8311 Varicose veins of right lower extremity with inflammation: Secondary | ICD-10-CM | POA: Diagnosis not present

## 2016-11-02 ENCOUNTER — Telehealth: Payer: Self-pay | Admitting: Obstetrics and Gynecology

## 2016-11-02 NOTE — Telephone Encounter (Signed)
Call to patient. Unable to talk now. Will call back in 15 minutes.

## 2016-11-02 NOTE — Telephone Encounter (Signed)
Patient returned call.  Reviewed benefit for surgery. Patient understood and agreeable. Patient has confirmed and is ready to proceed with scheduling. Patient aware this is professional benefit only. Patient aware will be contacted by hospital for separate benefits.  Patient would like to know if there is any availability for surgery dates, the last week of August or the first week of September. Routing to nurse supervisor for scheduling.  Routing to Lamont Snowball, RN  cc: Dr Quincy Simmonds

## 2016-11-02 NOTE — Telephone Encounter (Signed)
Placed call to patient to review re-calculated benefits for a recommended procedure. Left voicemail requesting a return call.   cc: Dr Quincy Simmonds  cc: Lamont Snowball, RN

## 2016-11-02 NOTE — Telephone Encounter (Signed)
Received call from patient. The patient and I had spoken in May, 2018 regarding benefits for recommended surgical procedure. Patient advises today, she is ready to proceed with scheduling. I advised patient we will re-verify benefits and pre-certification process and notify her of re-calculated benefits information. Patient is agreeable to return call and proceeding at that time.  Routing to Dr Quincy Simmonds  cc: Lamont Snowball, RN

## 2016-11-02 NOTE — Telephone Encounter (Signed)
Call from patient. Has decided she would like to proceed with surgery, including bilateral salpingectomy. Date options discussed. Patient will consider dates and call back once she has decided. This may be after her vacation next week.   Routing to provider for final review. Patient agreeable to disposition. Will close encounter.

## 2016-11-03 ENCOUNTER — Telehealth: Payer: Self-pay | Admitting: Obstetrics and Gynecology

## 2016-11-03 NOTE — Telephone Encounter (Signed)
Return call to patient. States she has reconsidered and wants to have ablation without salpingectomy. Has decided on surgery date of 11-28-16. Surgery instruction sheet reviewed and printed copy will be mailed to patient.Instructed to call back as needed with questions or concerns prior to appointment with Dr Quincy Simmonds on 11-15-16.  Routing to provider for final review. Patient agreeable to disposition. Will close encounter.

## 2016-11-03 NOTE — Telephone Encounter (Signed)
Patient called with questions for Gay Filler about surgery. She declined to give any other information.

## 2016-11-09 ENCOUNTER — Telehealth: Payer: Self-pay | Admitting: Obstetrics and Gynecology

## 2016-11-09 NOTE — Telephone Encounter (Signed)
Call placed to patient to discuss benefits for revised surgery plan. Left voicemail message requesting a return call.

## 2016-11-13 NOTE — Telephone Encounter (Signed)
Received return call from patient. Reviewed benefit information for revised surgery plan. Patient understood information presented and is agreeable. Patient is scheduled for 11/28/16. Patient had no further questions.   cc: Lamont Snowball, RN

## 2016-11-13 NOTE — Telephone Encounter (Signed)
Routing to provider for final review. Patient agreeable to disposition. Will close encounter.     

## 2016-11-14 ENCOUNTER — Encounter (HOSPITAL_COMMUNITY): Payer: Self-pay

## 2016-11-15 ENCOUNTER — Encounter: Payer: Self-pay | Admitting: Obstetrics and Gynecology

## 2016-11-15 ENCOUNTER — Ambulatory Visit (INDEPENDENT_AMBULATORY_CARE_PROVIDER_SITE_OTHER): Payer: 59 | Admitting: Obstetrics and Gynecology

## 2016-11-15 VITALS — BP 108/60 | HR 72 | Resp 16 | Wt 153.0 lb

## 2016-11-15 DIAGNOSIS — N766 Ulceration of vulva: Secondary | ICD-10-CM | POA: Diagnosis not present

## 2016-11-15 DIAGNOSIS — N920 Excessive and frequent menstruation with regular cycle: Secondary | ICD-10-CM | POA: Diagnosis not present

## 2016-11-15 MED ORDER — DOXYCYCLINE HYCLATE 100 MG PO CAPS: 100.0000 mg | ORAL_CAPSULE | Freq: Two times a day (BID) | ORAL | 0 refills | Status: DC

## 2016-11-15 MED FILL — DOXYCYCLINE HYCLATE 100 MG: 100 | 7 days supply | Qty: 14 | Fill #0

## 2016-11-15 NOTE — Progress Notes (Signed)
GYNECOLOGY  VISIT   HPI: 46 y.o.   Married  Caucasian  female   G2P2004 with Patient's last menstrual period was 10/31/2016 (exact date).   here for  Pre-op.  Desires Endometrial ablation due to heavy cycles.  Had IUD removed in May on the day of her sonohysterogram/EMB/IUD removal. At first, her menses was not significant. Then the end of July menses came back very strong.  No significant pain or discomfort.  Usually does not have any significant cramping.   Sonohysterogram showed no defects.  Has no fibroids.  Ovaries normal.  EMB showed progesterone effect and some acute inflammation and tissue breakdown.  Her acne has improved with IUD out.   Declines future childbearing.   Lost 12 pounds since last visit.   GYNECOLOGIC HISTORY: Patient's last menstrual period was 10/31/2016 (exact date). Contraception:  Vasectomy Menopausal hormone therapy:  n/a Last mammogram:  10/05/16 BIRADS 1 negative/density c Last pap smear:   07-01-15 Neg:Neg HR HPV; 06-30-14 Neg         OB History    Gravida Para Term Preterm AB Living   2 2 2     4    SAB TAB Ectopic Multiple Live Births           2         Patient Active Problem List   Diagnosis Date Noted  . Dermatitis 06/01/2016  . Wrist pain, right 02/03/2014  . Endometrial mass 07/24/2013  . Menorrhagia 06/26/2013    Past Medical History:  Diagnosis Date  . Abnormal Pap smear of cervix   . GERD (gastroesophageal reflux disease)    occ.  . Melanoma ([place]) 11/2012   left thigh  . PONV (postoperative nausea and vomiting)     Past Surgical History:  Procedure Laterality Date  . COLPOSCOPY  05/2010   Mild Dysplasia  . ENDOSCOPIC VEIN LASER TREATMENT Right 07/25/2016  . melanoma removal  11/2012   -left thigh  . WRIST FRACTURE SURGERY Left     Current Outpatient Prescriptions  Medication Sig Dispense Refill  . cetirizine (ZYRTEC) 10 MG tablet Take 10 mg by mouth daily.    . Multiple Vitamin (MULTIVITAMIN) tablet Take 1  tablet by mouth daily.    . Probiotic Product (PROBIOTIC PO) Take by mouth.    . RESTASIS MULTIDOSE 0.05 % ophthalmic emulsion      No current facility-administered medications for this visit.      ALLERGIES: Patient has no known allergies.  Family History  Problem Relation Age of Onset  . Breast cancer Mother   . Hypertension Mother   . Osteoporosis Mother   . Hyperlipidemia Father   . Prostate cancer Father   . Cancer Father        bladder ca  . Heart disease Paternal Grandmother     Social History   Social History  . Marital status: Married    Spouse name: N/A  . Number of children: N/A  . Years of education: N/A   Occupational History  . Not on file.   Social History Main Topics  . Smoking status: Never Smoker  . Smokeless tobacco: Never Used  . Alcohol use 1.8 - 2.4 oz/week    3 - 4 Glasses of wine per week  . Drug use: No  . Sexual activity: Yes    Partners: Male    Birth control/ protection: Other-see comments     Comment: husband w/vasectomy   Other Topics Concern  . Not on file  Social History Narrative  . No narrative on file    ROS:  Pertinent items are noted in HPI.  PHYSICAL EXAMINATION:    BP 108/60 (BP Location: Right Arm, Patient Position: Sitting, Cuff Size: Normal)   Pulse 72   Resp 16   Wt 153 lb (69.4 kg)   LMP 10/31/2016 (Exact Date)   BMI 21.64 kg/m     General appearance: alert, cooperative and appears stated age Head: Normocephalic, without obvious abnormality, atraumatic Neck: no adenopathy, supple, symmetrical, trachea midline and thyroid normal to inspection and palpation Lungs: clear to auscultation bilaterally Heart: regular rate and rhythm Abdomen: soft, non-tender, no masses,  no organomegaly Extremities: extremities normal, atraumatic, no cyanosis or edema Skin: Skin color, texture, turgor normal. No rashes or lesions No abnormal inguinal nodes palpated Neurologic: Grossly normal  Pelvic: External genitalia:    Slit like ulcer of the perineum.               Urethra:  normal appearing urethra with no masses, tenderness or lesions              Bartholins and Skenes: normal                 Vagina: normal appearing vagina with normal color and discharge, no lesions              Cervix: no lesions                Bimanual Exam:  Uterus:  normal size, contour, position, consistency, mobility, non-tender              Adnexa: no mass, fullness, tenderness            Chaperone was present for exam.  ASSESSMENT  Menorrhagia with regular cycle.  Inflammation on EMB.  Likely due to IUD (removed that day). Vulvar ulceration.  Hx oral HSV.   PLAN  Doxycycline 100 mg po bid x 7 days preop . Proceed with hysteroscopy with dilation and curettage, Novasure ablation.  She declines tubal removal. Risks, benefits, and alternatives to ablation reviewed and the patient wishes to proceed.  Expectations and recovery discussed.  HSV culture done.  Discussed HSV.  Declines antiviral unless the HSV cx is positive.    An After Visit Summary was printed and given to the patient.  __15____ minutes face to face time of which over 50% was spent in counseling.

## 2016-11-17 LAB — HERPES SIMPLEX VIRUS CULTURE

## 2016-11-28 ENCOUNTER — Encounter (HOSPITAL_COMMUNITY): Payer: Self-pay | Admitting: Certified Registered Nurse Anesthetist

## 2016-11-28 ENCOUNTER — Ambulatory Visit (HOSPITAL_COMMUNITY): Payer: 59 | Admitting: Anesthesiology

## 2016-11-28 ENCOUNTER — Ambulatory Visit (HOSPITAL_COMMUNITY)
Admission: RE | Admit: 2016-11-28 | Discharge: 2016-11-28 | Disposition: A | Discharge status: Home / Self Care | Payer: 59 | Source: Ambulatory Visit | Attending: Obstetrics and Gynecology | Admitting: Obstetrics and Gynecology

## 2016-11-28 ENCOUNTER — Encounter (HOSPITAL_COMMUNITY)
Admission: RE | Disposition: A | Discharge status: Home / Self Care | Payer: Self-pay | Source: Ambulatory Visit | Attending: Obstetrics and Gynecology

## 2016-11-28 DIAGNOSIS — N920 Excessive and frequent menstruation with regular cycle: Secondary | ICD-10-CM | POA: Diagnosis not present

## 2016-11-28 DIAGNOSIS — Z79899 Other long term (current) drug therapy: Secondary | ICD-10-CM | POA: Diagnosis not present

## 2016-11-28 DIAGNOSIS — N766 Ulceration of vulva: Secondary | ICD-10-CM | POA: Diagnosis not present

## 2016-11-28 DIAGNOSIS — M25531 Pain in right wrist: Secondary | ICD-10-CM | POA: Diagnosis not present

## 2016-11-28 DIAGNOSIS — L309 Dermatitis, unspecified: Secondary | ICD-10-CM | POA: Diagnosis not present

## 2016-11-28 HISTORY — DX: Other specified postprocedural states: Z98.890

## 2016-11-28 HISTORY — PX: DILITATION & CURRETTAGE/HYSTROSCOPY WITH NOVASURE ABLATION: SHX5568

## 2016-11-28 HISTORY — DX: Other specified postprocedural states: R11.2

## 2016-11-28 LAB — CBC
HCT: 43.6 % (ref 36.0–46.0)
HEMOGLOBIN: 14.8 g/dL (ref 12.0–15.0)
MCH: 30.1 pg (ref 26.0–34.0)
MCHC: 33.9 g/dL (ref 30.0–36.0)
MCV: 88.6 fL (ref 78.0–100.0)
PLATELETS: 187 10*3/uL (ref 150–400)
RBC: 4.92 MIL/uL (ref 3.87–5.11)
RDW: 13.2 % (ref 11.5–15.5)
WBC: 4.6 10*3/uL (ref 4.0–10.5)

## 2016-11-28 LAB — PREGNANCY, URINE: PREG TEST UR: NEGATIVE

## 2016-11-28 SURGERY — DILATATION & CURETTAGE/HYSTEROSCOPY WITH NOVASURE ABLATION
Anesthesia: General | Site: Vagina

## 2016-11-28 MED ORDER — DIPHENHYDRAMINE HCL 50 MG/ML IJ SOLN
INTRAMUSCULAR | Status: DC | PRN
  Administered 2016-11-28: 12.5 mg via INTRAVENOUS

## 2016-11-28 MED ORDER — ONDANSETRON HCL 4 MG/2ML IJ SOLN
INTRAMUSCULAR | Status: DC | PRN
Start: 2016-11-28 — End: 2016-11-28
  Administered 2016-11-28: 4 mg via INTRAVENOUS

## 2016-11-28 MED ORDER — SCOPOLAMINE 1 MG/3DAYS TD PT72
1.0000 | MEDICATED_PATCH | TRANSDERMAL | Status: DC
  Administered 2016-11-28: 1.5 mg via TRANSDERMAL

## 2016-11-28 MED ORDER — HYDROMORPHONE HCL 1 MG/ML IJ SOLN: 0.2500 mg | INTRAMUSCULAR | Status: DC | PRN

## 2016-11-28 MED ORDER — LACTATED RINGERS IV SOLN
INTRAVENOUS | Status: DC
  Administered 2016-11-28: 09:00:00 via INTRAVENOUS

## 2016-11-28 MED ORDER — LIDOCAINE HCL (CARDIAC) 20 MG/ML IV SOLN
INTRAVENOUS | Status: AC
  Filled 2016-11-28: qty 5

## 2016-11-28 MED ORDER — LIDOCAINE HCL (CARDIAC) 20 MG/ML IV SOLN
INTRAVENOUS | Status: DC | PRN
  Administered 2016-11-28: 60 mg via INTRAVENOUS

## 2016-11-28 MED ORDER — LIDOCAINE HCL 1 % IJ SOLN
INTRAMUSCULAR | Status: DC | PRN
  Administered 2016-11-28: 10 mL

## 2016-11-28 MED ORDER — SCOPOLAMINE 1 MG/3DAYS TD PT72: 1.0000 | MEDICATED_PATCH | Freq: Once | TRANSDERMAL | Status: DC

## 2016-11-28 MED ORDER — SCOPOLAMINE 1 MG/3DAYS TD PT72
MEDICATED_PATCH | TRANSDERMAL | Status: AC
  Administered 2016-11-28: 1.5 mg via TRANSDERMAL
  Filled 2016-11-28: qty 1

## 2016-11-28 MED ORDER — SODIUM CHLORIDE 0.9 % IR SOLN
Status: DC | PRN
  Administered 2016-11-28: 3000 mL

## 2016-11-28 MED ORDER — ONDANSETRON HCL 4 MG/2ML IJ SOLN
INTRAMUSCULAR | Status: AC
  Filled 2016-11-28: qty 2

## 2016-11-28 MED ORDER — DEXAMETHASONE SODIUM PHOSPHATE 4 MG/ML IJ SOLN
INTRAMUSCULAR | Status: AC
  Filled 2016-11-28: qty 1

## 2016-11-28 MED ORDER — MIDAZOLAM HCL 2 MG/2ML IJ SOLN
INTRAMUSCULAR | Status: DC | PRN
  Administered 2016-11-28: 2 mg via INTRAVENOUS

## 2016-11-28 MED ORDER — FENTANYL CITRATE (PF) 100 MCG/2ML IJ SOLN
INTRAMUSCULAR | Status: DC | PRN
  Administered 2016-11-28: 50 ug via INTRAVENOUS
  Administered 2016-11-28: 25 ug via INTRAVENOUS
  Administered 2016-11-28: 50 ug via INTRAVENOUS

## 2016-11-28 MED ORDER — KETOROLAC TROMETHAMINE 30 MG/ML IJ SOLN
INTRAMUSCULAR | Status: AC
  Filled 2016-11-28: qty 1

## 2016-11-28 MED ORDER — DEXAMETHASONE SODIUM PHOSPHATE 4 MG/ML IJ SOLN
INTRAMUSCULAR | Status: DC | PRN
  Administered 2016-11-28: 4 mg via INTRAVENOUS

## 2016-11-28 MED ORDER — PROPOFOL 10 MG/ML IV BOLUS
INTRAVENOUS | Status: AC
  Filled 2016-11-28: qty 20

## 2016-11-28 MED ORDER — IBUPROFEN 800 MG PO TABS: 800.0000 mg | ORAL_TABLET | Freq: Three times a day (TID) | ORAL | 0 refills | Status: DC | PRN

## 2016-11-28 MED ORDER — MIDAZOLAM HCL 2 MG/2ML IJ SOLN
INTRAMUSCULAR | Status: AC
  Filled 2016-11-28: qty 2

## 2016-11-28 MED ORDER — SCOPOLAMINE 1 MG/3DAYS TD PT72: 1.0000 | MEDICATED_PATCH | Freq: Once | TRANSDERMAL | 12 refills | Status: AC

## 2016-11-28 MED ORDER — FENTANYL CITRATE (PF) 100 MCG/2ML IJ SOLN
INTRAMUSCULAR | Status: AC
  Filled 2016-11-28: qty 2

## 2016-11-28 MED ORDER — PROPOFOL 10 MG/ML IV BOLUS
INTRAVENOUS | Status: DC | PRN
  Administered 2016-11-28: 170 mg via INTRAVENOUS

## 2016-11-28 MED ORDER — PROMETHAZINE HCL 25 MG/ML IJ SOLN: 6.2500 mg | INTRAMUSCULAR | Status: DC | PRN

## 2016-11-28 MED ORDER — LIDOCAINE HCL 1 % IJ SOLN
INTRAMUSCULAR | Status: AC
  Filled 2016-11-28: qty 20

## 2016-11-28 MED ORDER — KETOROLAC TROMETHAMINE 30 MG/ML IJ SOLN
INTRAMUSCULAR | Status: DC | PRN
  Administered 2016-11-28: 30 mg via INTRAVENOUS

## 2016-11-28 SURGICAL SUPPLY — 16 items
ABLATOR ENDOMETRIAL BIPOLAR (ABLATOR) ×2 IMPLANT
BIPOLAR CUTTING LOOP 21FR (ELECTRODE)
CANISTER SUCT 3000ML PPV (MISCELLANEOUS) ×2 IMPLANT
CATH ROBINSON RED A/P 16FR (CATHETERS) ×2 IMPLANT
CLOTH BEACON ORANGE TIMEOUT ST (SAFETY) ×2 IMPLANT
CONTAINER PREFILL 10% NBF 60ML (FORM) ×4 IMPLANT
GLOVE BIO SURGEON STRL SZ 6.5 (GLOVE) ×2 IMPLANT
GLOVE BIOGEL PI IND STRL 7.0 (GLOVE) ×1 IMPLANT
GLOVE BIOGEL PI INDICATOR 7.0 (GLOVE) ×1
GOWN STRL REUS W/TWL LRG LVL3 (GOWN DISPOSABLE) ×4 IMPLANT
LOOP CUTTING BIPOLAR 21FR (ELECTRODE) IMPLANT
PACK VAGINAL MINOR WOMEN LF (CUSTOM PROCEDURE TRAY) ×2 IMPLANT
PAD OB MATERNITY 4.3X12.25 (PERSONAL CARE ITEMS) ×2 IMPLANT
TOWEL OR 17X24 6PK STRL BLUE (TOWEL DISPOSABLE) ×4 IMPLANT
TUBING AQUILEX INFLOW (TUBING) ×2 IMPLANT
TUBING AQUILEX OUTFLOW (TUBING) ×2 IMPLANT

## 2016-11-28 NOTE — Brief Op Note (Signed)
11/28/2016  11:00 AM  PATIENT:  Ashley Quinn  46 y.o. female  PRE-OPERATIVE DIAGNOSIS:  metrorrhagia  POST-OPERATIVE DIAGNOSIS:  menorrhagia  PROCEDURE:  Procedure(s): DILATATION & CURETTAGE/HYSTEROSCOPY WITH ROLLER BALL ABLATION W/BIPOLAR CAUTERY AND ATTEMPTED NOVASURE ABLATION (N/A)  SURGEON:  Surgeon(s) and Role:    * Amundson Raliegh Ip, MD - Primary  PHYSICIAN ASSISTANT:   NA   ASSISTANTS: none   ANESTHESIA:   paracervical block and  LMA.  EBL:  Total I/O In: 500 [I.V.:500] Out: 210 [Urine:200; Blood:10]  NS deficit:  1150 cc NS.   Uterine cavity length:  9 cm, cervical length 4.5 cm.   BLOOD ADMINISTERED:none  DRAINS: none   LOCAL MEDICATIONS USED:  LIDOCAINE  and Amount:  10 ml  SPECIMEN:  Source of Specimen:  endometrial curettings.   DISPOSITION OF SPECIMEN:  PATHOLOGY  COUNTS:  YES  TOURNIQUET:  * No tourniquets in log *  DICTATION: .Other Dictation: Dictation Number    PLAN OF CARE: Discharge to home after PACU  PATIENT DISPOSITION:  PACU - hemodynamically stable.   Delay start of Pharmacological VTE agent (>24hrs) due to surgical blood loss or risk of bleeding: not applicable

## 2016-11-28 NOTE — Progress Notes (Signed)
Update to History and Physical  No marked change in status since office preop visit.   Patient examined.   Ok to proceed.  

## 2016-11-28 NOTE — Anesthesia Preprocedure Evaluation (Addendum)
Anesthesia Evaluation  Patient identified by MRN, date of birth, ID band Patient awake    Reviewed: Allergy & Precautions, NPO status , Patient's Chart, lab work & pertinent test results  History of Anesthesia Complications (+) PONV and history of anesthetic complications  Airway Mallampati: II  TM Distance: >3 FB     Dental no notable dental hx. (+) Dental Advisory Given   Pulmonary neg pulmonary ROS,    Pulmonary exam normal        Cardiovascular negative cardio ROS Normal cardiovascular exam     Neuro/Psych negative neurological ROS  negative psych ROS   GI/Hepatic Neg liver ROS, GERD  ,  Endo/Other  negative endocrine ROS  Renal/GU negative Renal ROS  negative genitourinary   Musculoskeletal negative musculoskeletal ROS (+)   Abdominal   Peds negative pediatric ROS (+)  Hematology negative hematology ROS (+)   Anesthesia Other Findings   Reproductive/Obstetrics negative OB ROS                            Anesthesia Physical Anesthesia Plan  ASA: II  Anesthesia Plan: General   Post-op Pain Management:    Induction: Intravenous  PONV Risk Score and Plan: 3 and Ondansetron, Dexamethasone and Scopolamine patch - Pre-op  Airway Management Planned: LMA  Additional Equipment:   Intra-op Plan:   Post-operative Plan: Extubation in OR  Informed Consent: I have reviewed the patients History and Physical, chart, labs and discussed the procedure including the risks, benefits and alternatives for the proposed anesthesia with the patient or authorized representative who has indicated his/her understanding and acceptance.   Dental advisory given  Plan Discussed with: CRNA, Anesthesiologist and Surgeon  Anesthesia Plan Comments:        Anesthesia Quick Evaluation

## 2016-11-28 NOTE — H&P (Signed)
Office Visit   11/15/2016 [place] Silva, Everardo All, MD  Obstetrics and Gynecology   Menorrhagia with regular cycle +1 more  Dx   Office Visit   Reason for Visit   Additional Documentation   Vitals:   BP 108/60 (BP Location: Right Arm, Patient Position: Sitting, Cuff Size: Normal)   Pulse 72   Resp 16   Wt 153 lb (69.4 kg)   LMP 10/31/2016 (Exact Date)   BMI 21.64 kg/m   BSA 1.86 m   Flowsheets:   Infectious Disease Screening,   Custom Formula Data,   MEWS Score,   Anthropometrics     Encounter Info:   Billing Info,   History,   Allergies,   Detailed Report     All Notes   Progress Notes by Nunzio Cobbs, MD at 11/15/2016 8:15 AM   Author: Nunzio Cobbs, MD Author Type: Physician Filed: 11/15/2016 9:24 AM  Note Status: Signed Cosign: Cosign Not Required Encounter Date: 11/15/2016  Editor: Nunzio Cobbs, MD (Physician)  Prior Versions: 1. Archie Balboa CMA (Certified Psychologist, sport and exercise) at 11/15/2016 8:24 AM - Sign at close encounter    GYNECOLOGY  VISIT   HPI: 46 y.o.   Married  Caucasian  female   G2P2004 with Patient's last menstrual period was 10/31/2016 (exact date).   here for  Pre-op.  Desires Endometrial ablation due to heavy cycles.  Had IUD removed in May on the day of her sonohysterogram/EMB/IUD removal. At first, her menses was not significant. Then the end of July menses came back very strong.  No significant pain or discomfort.  Usually does not have any significant cramping.   Sonohysterogram showed no defects.  Has no fibroids.  Ovaries normal.  EMB showed progesterone effect and some acute inflammation and tissue breakdown.  Her acne has improved with IUD out.   Declines future childbearing.   Lost 12 pounds since last visit.   GYNECOLOGIC HISTORY: Patient's last menstrual period was 10/31/2016 (exact date). Contraception:  Vasectomy Menopausal hormone therapy:   n/a Last mammogram:  10/05/16 BIRADS 1 negative/density c Last pap smear:   07-01-15 Neg:Neg HR HPV; 06-30-14 Neg                OB History    Gravida Para Term Preterm AB Living   2 2 2     4    SAB TAB Ectopic Multiple Live Births           2             Patient Active Problem List   Diagnosis Date Noted  . Dermatitis 06/01/2016  . Wrist pain, right 02/03/2014  . Endometrial mass 07/24/2013  . Menorrhagia 06/26/2013        Past Medical History:  Diagnosis Date  . Abnormal Pap smear of cervix   . GERD (gastroesophageal reflux disease)    occ.  . Melanoma ([place]) 11/2012   left thigh  . PONV (postoperative nausea and vomiting)          Past Surgical History:  Procedure Laterality Date  . COLPOSCOPY  05/2010   Mild Dysplasia  . ENDOSCOPIC VEIN LASER TREATMENT Right 07/25/2016  . melanoma removal  11/2012   -left thigh  . WRIST FRACTURE SURGERY Left           Current Outpatient Prescriptions  Medication Sig Dispense Refill  . cetirizine (ZYRTEC) 10 MG tablet Take 10  mg by mouth daily.    . Multiple Vitamin (MULTIVITAMIN) tablet Take 1 tablet by mouth daily.    . Probiotic Product (PROBIOTIC PO) Take by mouth.    . RESTASIS MULTIDOSE 0.05 % ophthalmic emulsion      No current facility-administered medications for this visit.      ALLERGIES: Patient has no known allergies.       Family History  Problem Relation Age of Onset  . Breast cancer Mother   . Hypertension Mother   . Osteoporosis Mother   . Hyperlipidemia Father   . Prostate cancer Father   . Cancer Father        bladder ca  . Heart disease Paternal Grandmother     Social History        Social History  . Marital status: Married    Spouse name: N/A  . Number of children: N/A  . Years of education: N/A      Occupational History  . Not on file.         Social History Main Topics  . Smoking status: Never Smoker  . Smokeless tobacco:  Never Used  . Alcohol use 1.8 - 2.4 oz/week    3 - 4 Glasses of wine per week  . Drug use: No  . Sexual activity: Yes    Partners: Male    Birth control/ protection: Other-see comments     Comment: husband w/vasectomy       Other Topics Concern  . Not on file      Social History Narrative  . No narrative on file    ROS:  Pertinent items are noted in HPI.  PHYSICAL EXAMINATION:    BP 108/60 (BP Location: Right Arm, Patient Position: Sitting, Cuff Size: Normal)   Pulse 72   Resp 16   Wt 153 lb (69.4 kg)   LMP 10/31/2016 (Exact Date)   BMI 21.64 kg/m     General appearance: alert, cooperative and appears stated age Head: Normocephalic, without obvious abnormality, atraumatic Neck: no adenopathy, supple, symmetrical, trachea midline and thyroid normal to inspection and palpation Lungs: clear to auscultation bilaterally Heart: regular rate and rhythm Abdomen: soft, non-tender, no masses,  no organomegaly Extremities: extremities normal, atraumatic, no cyanosis or edema Skin: Skin color, texture, turgor normal. No rashes or lesions No abnormal inguinal nodes palpated Neurologic: Grossly normal  Pelvic: External genitalia:   Slit like ulcer of the perineum.               Urethra:  normal appearing urethra with no masses, tenderness or lesions              Bartholins and Skenes: normal                 Vagina: normal appearing vagina with normal color and discharge, no lesions              Cervix: no lesions                Bimanual Exam:  Uterus:  normal size, contour, position, consistency, mobility, non-tender              Adnexa: no mass, fullness, tenderness            Chaperone was present for exam.  ASSESSMENT  Menorrhagia with regular cycle.  Inflammation on EMB.  Likely due to IUD (removed that day). Vulvar ulceration.  Hx oral HSV.   PLAN  Doxycycline 100 mg po bid x 7 days  preop . Proceed with hysteroscopy with dilation and  curettage, Novasure ablation.  She declines tubal removal. Risks, benefits, and alternatives to ablation reviewed and the patient wishes to proceed.  Expectations and recovery discussed.  HSV culture done.  Discussed HSV.  Declines antiviral unless the HSV cx is positive.    An After Visit Summary was printed and given to the patient.  __15____ minutes face to face time of which over 50% was spent in counseling.

## 2016-11-28 NOTE — Anesthesia Postprocedure Evaluation (Signed)
Anesthesia Post Note  Patient: Ashley Quinn. [place]  Procedure(s) Performed: Procedure(s) (LRB): DILATATION & CURETTAGE/HYSTEROSCOPY WITH ROLLER BALL ABLATION W/BIPOLAR CAUTERY AND ATTEMPTED NOVASURE ABLATION (N/A)     Patient location during evaluation: PACU Anesthesia Type: General Level of consciousness: awake and alert and oriented Pain management: pain level controlled Vital Signs Assessment: post-procedure vital signs reviewed and stable Respiratory status: spontaneous breathing, nonlabored ventilation and respiratory function stable Cardiovascular status: blood pressure returned to baseline and stable Postop Assessment: no signs of nausea or vomiting Anesthetic complications: no    Last Vitals:  Vitals:   11/28/16 1115 11/28/16 1130  BP: 108/70 110/69  Pulse: 72 71  Resp: 14 15  Temp:    SpO2: 98% 100%    Last Pain:  Vitals:   11/28/16 0821  TempSrc: Oral   Pain Goal: Patients Stated Pain Goal: 3 (11/28/16 0821)               Ashley Quinn A.

## 2016-11-28 NOTE — Transfer of Care (Signed)
Immediate Anesthesia Transfer of Care Note  Patient: Ashley Quinn  Procedure(s) Performed: Procedure(s): DILATATION & CURETTAGE/HYSTEROSCOPY WITH ROLLER BALL ABLATION W/BIPOLAR CAUTERY AND ATTEMPTED NOVASURE ABLATION (N/A)  Patient Location: PACU  Anesthesia Type:General  Level of Consciousness: awake, alert  and oriented  Airway & Oxygen Therapy: Patient Spontanous Breathing and Patient connected to nasal cannula oxygen  Post-op Assessment: Report given to RN and Post -op Vital signs reviewed and stable  Post vital signs: Reviewed and stable  Last Vitals:  Vitals:   11/28/16 0821  BP: 122/76  Pulse: 85  Resp: 18  Temp: 36.7 C  SpO2: 100%    Last Pain:  Vitals:   11/28/16 0821  TempSrc: Oral      Patients Stated Pain Goal: 3 (74/93/55 2174)  Complications: No apparent anesthesia complications

## 2016-11-28 NOTE — Anesthesia Procedure Notes (Signed)
Performed by: Raenette Rover

## 2016-11-28 NOTE — Anesthesia Procedure Notes (Signed)
Procedure Name: LMA Insertion Date/Time: 11/28/2016 9:44 AM Performed by: Raenette Rover Pre-anesthesia Checklist: Patient identified, Emergency Drugs available, Suction available and Patient being monitored Patient Re-evaluated:Patient Re-evaluated prior to induction Oxygen Delivery Method: Circle system utilized Preoxygenation: Pre-oxygenation with 100% oxygen Induction Type: IV induction LMA: LMA inserted LMA Size: 4.0 Number of attempts: 2 Placement Confirmation: positive ETCO2,  CO2 detector and breath sounds checked- equal and bilateral Tube secured with: Tape Dental Injury: Teeth and Oropharynx as per pre-operative assessment

## 2016-11-28 NOTE — Discharge Instructions (Signed)
Hi Ashley Quinn,   Your uterine cavity did not fit the Novasure device well, so I neede to use an alternative method. I did the ablation using bipolar energy (the same kind as used for the Novasure) but did it the old fashioned way with a roller ball technique.  This paints energy across the endometrial surfaces and burns the tissue in a similar fashion, but just takes longer to perform.  I was able to treat a lot of the surfaces but not all of them.  Some of the blood from your period made it difficult to treat all of these areas.  My expectation is that you will still have periods following the procedure, but that the bleeding should be less overall.   I will see you in about 10 days for a post op visit instead of 4 weeks so we can talk further.   Call if you have any heavy bleeding like changing a pad every 1 - 2 hours, pain not controlled by Motrin, or fever.   Josefa Half, MD  Endometrial Ablation Endometrial ablation is a procedure that destroys the thin inner layer of the lining of the uterus (endometrium). This procedure may be done:  To stop heavy periods.  To stop bleeding that is causing anemia.  To control irregular bleeding.  To treat bleeding caused by small tumors (fibroids) in the endometrium.  This procedure is often an alternative to major surgery, such as removal of the uterus and cervix (hysterectomy). As a result of this procedure:  You may not be able to have children. However, if you are premenopausal (you have not gone through menopause): ? You may still have a small chance of getting pregnant. ? You will need to use a reliable method of birth control after the procedure to prevent pregnancy.  You may stop having a menstrual period, or you may have only a small amount of bleeding during your period. Menstruation may return several years after the procedure.  Tell a health care provider about:  Any allergies you have.  All medicines you are taking, including  vitamins, herbs, eye drops, creams, and over-the-counter medicines.  Any problems you or family members have had with the use of anesthetic medicines.  Any blood disorders you have.  Any surgeries you have had.  Any medical conditions you have. What are the risks? Generally, this is a safe procedure. However, problems may occur, including:  A hole (perforation) in the uterus or bowel.  Infection of the uterus, bladder, or vagina.  Bleeding.  Damage to other structures or organs.  An air bubble in the lung (air embolus).  Problems with pregnancy after the procedure.  Failure of the procedure.  Decreased ability to diagnose cancer in the endometrium.  What happens before the procedure?  You will have tests of your endometrium to make sure there are no pre-cancerous cells or cancer cells present.  You may have an ultrasound of the uterus.  You may be given medicines to thin the endometrium.  Ask your health care provider about: ? Changing or stopping your regular medicines. This is especially important if you take diabetes medicines or blood thinners. ? Taking medicines such as aspirin and ibuprofen. These medicines can thin your blood. Do not take these medicines before your procedure if your doctor tells you not to.  Plan to have someone take you home from the hospital or clinic. What happens during the procedure?  You will lie on an exam table with your feet and  legs supported as in a pelvic exam.  To lower your risk of infection: ? Your health care team will wash or sanitize their hands and put on germ-free (sterile) gloves. ? Your genital area will be washed with soap.  An IV tube will be inserted into one of your veins.  You will be given a medicine to help you relax (sedative).  A surgical instrument with a light and camera (resectoscope) will be inserted into your vagina and moved into your uterus. This allows your surgeon to see inside your  uterus.  Endometrial tissue will be removed using one of the following methods: ? Radiofrequency. This method uses a radiofrequency-alternating electric current to remove the endometrium. ? Cryotherapy. This method uses extreme cold to freeze the endometrium. ? Heated-free liquid. This method uses a heated saltwater (saline) solution to remove the endometrium. ? Microwave. This method uses high-energy microwaves to heat up the endometrium and remove it. ? Thermal balloon. This method involves inserting a catheter with a balloon tip into the uterus. The balloon tip is filled with heated fluid to remove the endometrium. The procedure may vary among health care providers and hospitals. What happens after the procedure?  Your blood pressure, heart rate, breathing rate, and blood oxygen level will be monitored until the medicines you were given have worn off.  As tissue healing occurs, you may notice vaginal bleeding for 4-6 weeks after the procedure. You may also experience: ? Cramps. ? Thin, watery vaginal discharge that is light pink or brown in color. ? A need to urinate more frequently than usual. ? Nausea.  Do not drive for 24 hours if you were given a sedative.  Do not have sex or insert anything into your vagina until your health care provider approves. Summary  Endometrial ablation is done to treat the many causes of heavy menstrual bleeding.  The procedure may be done only after medications have been tried to control the bleeding.  Plan to have someone take you home from the hospital or clinic. This information is not intended to replace advice given to you by your health care provider. Make sure you discuss any questions you have with your health care provider. Document Released: 02/04/2004 Document Revised: 04/13/2016 Document Reviewed: 04/13/2016 Elsevier Interactive Patient Education  2017 [employer].

## 2016-11-29 ENCOUNTER — Encounter (HOSPITAL_COMMUNITY): Payer: Self-pay | Admitting: Obstetrics and Gynecology

## 2016-11-29 NOTE — Op Note (Signed)
NAME:  RIFFE-GUYER, Robbyn         ACCOUNT NO.:  [id number]  MEDICAL RECORD NO.:  [id number]  LOCATION:                                 FACILITY:  PHYSICIAN:  Lenard Galloway, M.D.        DATE OF BIRTH:  DATE OF PROCEDURE:  11/28/2016 DATE OF DISCHARGE:                              OPERATIVE REPORT   PREOPERATIVE DIAGNOSIS:  Menorrhagia with regular cycles.  POSTOPERATIVE DIAGNOSIS:  Menorrhagia with regular cycles.  PROCEDURES:  Hysteroscopy with dilation and curettage, attempted NovaSure endometrial ablation, rollerball ablation with bipolar cautery.  SURGEON:  Lenard Galloway, M.D.  ANESTHESIA:  LMA, paracervical block with 10 mL of 1% lidocaine.  IV FLUIDS:  500 mL Ringer's lactate.  ESTIMATED BLOOD LOSS:  10 mL.  URINE OUTPUT:  200 mL.  NORMAL SALINE DEFICIT:  1150 mL.  COMPLICATIONS:  None.  INDICATIONS FOR THE PROCEDURE:  The patient is a 46 year old gravida 2, para 2 Caucasian female who presents with heavy menstrual periods.  The patient previously has had a Mirena IUD and requested removal due to irregular bleeding and cystic acne.  The patient did have evaluation in the office, showing a normal pelvic ultrasound and sonohysterogram. Endometrial biopsy which showed progesterone effect and some acute inflammation and tissue breakdown which was attributed to her IUD. She did receive a course of doxycycline. The patient declined any future childbearing and uses vasectomy as contraception.  The patient is requesting NovaSure endometrial ablation and the plan is now made to proceed after risks, benefits, and alternatives are reviewed.  Exam under anesthesia revealed a small anteverted mobile uterus.  No adnexal masses were appreciated.  The uterus was sounded to 9 cm.  Hysteroscopy demonstrated the uterine cavity to be normal and without evidence of polyp or fibroid.  There was evidence of some clotting from the patient's current menstruation.  SPECIMEN:  Scant  amount of endometrial curettings were sent to Pathology.  DESCRIPTION OF PROCEDURE:  The patient was re-identified in the preoperative hold area.  The patient received TED hose and PAS stockings for DVT prophylaxis.  The patient was escorted to the operating room, where she was placed in the dorsal lithotomy position with Allen stirrups.  An LMA anesthetic was introduced.  The patient's lower abdomen, vagina, and perineum were sterilely prepped and draped.  Exam under anesthesia was performed.  A speculum was placed inside the vagina and a single-tooth tenaculum was placed on the anterior cervical lip.  Paracervical block was performed with a total of 10 mL of 1% lidocaine.  The uterus was sounded.  The cervical length was measured with a Hegar dilator.  The cervix was dilated to a #21 Pratt dilator and a 2.9 mm diagnostic hysteroscope was then inserted into the uterine cavity under the continuous infusion of normal saline fluid.  Findings are as noted above.  The hysteroscope was withdrawn and endometrial curettings were obtained and were sent to Pathology.  The cervix was then further dilated to a #25 Pratt dilator. The NovaSure device was placed inside the uterine cavity to the level of the uterine fundus and was withdrawn slightly.  The NovaSure was locked into place and the cavity width was tested which  was demonstrating a width between 2 and 2.5 cm.  The NovaSure device was removed and the hysteroscope was replaced and the uterine cavity was noted to be intact and normal.  Measurements were rechecked on the uterine length and the endocervical length and the precalculated measurements were adjusted slightly to a uterine length of 9 cm and the cervical length of 4.5 cm. The NovaSure was again inserted into the uterine cavity and the cavity width was found to be unchanged.  The NovaSure device was removed and a new NovaSure was inserted and exactly the same uterine width  was obtained.  At this point, the NovaSure was abandoned due to the small measurements and the plan was made to proceed with a rollerball bipolar cautery ablation procedure using the 2.9 mm hysteroscope.  The patient did receive a grounding pad.  The energy was set at 2.  The rollerball was inserted into the uterine cavity and the ablation was performed on the posterior uterine fundus and then along the sidewalls.  The portion of the anterior wall was treated as well.  Due to blood clot and reduced visualization at this point, the procedure was discontinued.  The device was withdrawn from the uterine cavity and the tenaculum was removed from the anterior cervical lip.  Hemostasis was good.  The speculum was withdrawn.  The patient was cleansed of Betadine.  She was awakened and escorted to the recovery room in stable condition.  There were no complications to the procedure.  All needle, instrument, and sponge counts were correct.     Lenard Galloway, M.D.     BES/MEDQ  D:  11/28/2016  T:  11/29/2016  Job:  [id number]

## 2016-11-30 ENCOUNTER — Telehealth: Payer: Self-pay | Admitting: Obstetrics and Gynecology

## 2016-11-30 NOTE — Telephone Encounter (Signed)
Phone call to patient in follow up to her endometrial ablation.   Her pathology report from the dilation and curettage is benign.   She states she is doing well and does not have any significant pain or bleeding.   I attempted a Novasure endometrial ablation and the device did not fit her cavity well for me to use this method.  I instead did a roller ball with bipolar cautery and was not able to treat the entire uterine cavity due to some decreased visualization of the anterior fundus.  I told her I expected her menstrual cycles will continue but would be less in quantity.   She will have follow up with me next week for her post op visit.

## 2016-12-04 NOTE — Progress Notes (Signed)
GYNECOLOGY  VISIT   HPI: 46 y.o.   Married  Caucasian  female   G2P2004 with Patient's last menstrual period was 11/26/2016.   here for 2 week follow up  [place] W/BIPOLAR CAUTERY AND ATTEMPTED NOVASURE ABLATION (N/A Vagina ).  States very minimal pain or bleeding post op.  Planning left wrist surgery this year.  GYNECOLOGIC HISTORY: Patient's last menstrual period was 11/26/2016. Contraception:  Vasectomy Menopausal hormone therapy:  none Last mammogram:  10/05/16 BIRADS 1 negative/density c Last pap smear: 07-01-15 Neg:Neg HR HPV; 06-30-14 Neg        OB History    Gravida Para Term Preterm AB Living   2 2 2     4    SAB TAB Ectopic Multiple Live Births           2         Patient Active Problem List   Diagnosis Date Noted  . Dermatitis 06/01/2016  . Wrist pain, right 02/03/2014  . Endometrial mass 07/24/2013  . Menorrhagia 06/26/2013    Past Medical History:  Diagnosis Date  . Abnormal Pap smear of cervix   . GERD (gastroesophageal reflux disease)    occ.  . Melanoma ([place]) 11/2012   left thigh  . PONV (postoperative nausea and vomiting)     Past Surgical History:  Procedure Laterality Date  . COLPOSCOPY  05/2010   Mild Dysplasia  . DILITATION & CURRETTAGE/HYSTROSCOPY WITH NOVASURE ABLATION N/A 11/28/2016   Procedure: DILATATION & CURETTAGE/HYSTEROSCOPY WITH ROLLER BALL ABLATION W/BIPOLAR CAUTERY AND ATTEMPTED NOVASURE ABLATION;  Surgeon: Nunzio Cobbs, MD;  Location: [facility];  Service: Gynecology;  Laterality: N/A;  . ENDOSCOPIC VEIN LASER TREATMENT Right 07/25/2016  . melanoma removal  11/2012   -left thigh  . WRIST FRACTURE SURGERY Left     Current Outpatient Prescriptions  Medication Sig Dispense Refill  . cetirizine (ZYRTEC) 10 MG tablet Take 10 mg by mouth daily as needed for allergies.     Marland Kitchen ibuprofen (ADVIL,MOTRIN) 800 MG tablet Take 1 tablet (800 mg total) by mouth every 8 (eight) hours  as needed. 30 tablet 0  . Multiple Vitamin (MULTIVITAMIN) tablet Take 1 tablet by mouth daily.    . Probiotic Product (PROBIOTIC PO) Take 1 capsule by mouth daily.     . RESTASIS MULTIDOSE 0.05 % ophthalmic emulsion Place 1 drop into both eyes 2 (two) times daily.      No current facility-administered medications for this visit.      ALLERGIES: Patient has no known allergies.  Family History  Problem Relation Age of Onset  . Breast cancer Mother   . Hypertension Mother   . Osteoporosis Mother   . Hyperlipidemia Father   . Prostate cancer Father   . Cancer Father        bladder ca  . Heart disease Paternal Grandmother     Social History   Social History  . Marital status: Married    Spouse name: N/A  . Number of children: N/A  . Years of education: N/A   Occupational History  . Not on file.   Social History Main Topics  . Smoking status: Never Smoker  . Smokeless tobacco: Never Used  . Alcohol use 1.8 - 2.4 oz/week    3 - 4 Glasses of wine per week  . Drug use: No  . Sexual activity: Yes    Partners: Male    Birth control/ protection: Other-see comments  Comment: husband w/vasectomy   Other Topics Concern  . Not on file   Social History Narrative  . No narrative on file    ROS:  Pertinent items are noted in HPI.  PHYSICAL EXAMINATION:    BP 100/60 (BP Location: Right Arm, Patient Position: Sitting, Cuff Size: Normal)   Pulse 88   Resp 16   Wt 155 lb (70.3 kg)   LMP 11/26/2016   BMI 21.93 kg/m     General appearance: alert, cooperative and appears stated age  Pelvic: External genitalia:  no lesions              Urethra:  normal appearing urethra with no masses, tenderness or lesions              Bartholins and Skenes: normal                 Vagina: normal appearing vagina with normal color and discharge, no lesions              Cervix: no lesions                Bimanual Exam:  Uterus:  normal size, contour, position, consistency, mobility,  non-tender              Adnexa: no mass, fullness, tenderness              Chaperone was present for exam.  ASSESSMENT  Status post attempted Novasure ablation, conversion to bipolar rollerball ablation, dilation and curettage. Doing well post op.  PLAN  Surgical findings, procedure, and pathology report discussed with the patient.  Questions answered. I expect she will continue with some form of menses post op.  Follow up in 3 - 4 months.  She will call back to schedule.    An After Visit Summary was printed and given to the patient.

## 2016-12-06 ENCOUNTER — Encounter: Payer: Self-pay | Admitting: Obstetrics and Gynecology

## 2016-12-06 ENCOUNTER — Ambulatory Visit (INDEPENDENT_AMBULATORY_CARE_PROVIDER_SITE_OTHER): Payer: 59 | Admitting: Obstetrics and Gynecology

## 2016-12-06 VITALS — BP 100/60 | HR 88 | Resp 16 | Wt 155.0 lb

## 2016-12-06 DIAGNOSIS — Z9889 Other specified postprocedural states: Secondary | ICD-10-CM

## 2016-12-21 DIAGNOSIS — D1801 Hemangioma of skin and subcutaneous tissue: Secondary | ICD-10-CM | POA: Diagnosis not present

## 2016-12-21 DIAGNOSIS — L821 Other seborrheic keratosis: Secondary | ICD-10-CM | POA: Diagnosis not present

## 2016-12-21 DIAGNOSIS — D225 Melanocytic nevi of trunk: Secondary | ICD-10-CM | POA: Diagnosis not present

## 2016-12-21 DIAGNOSIS — D2262 Melanocytic nevi of left upper limb, including shoulder: Secondary | ICD-10-CM | POA: Diagnosis not present

## 2016-12-21 DIAGNOSIS — Z85820 Personal history of malignant melanoma of skin: Secondary | ICD-10-CM | POA: Diagnosis not present

## 2016-12-21 DIAGNOSIS — L814 Other melanin hyperpigmentation: Secondary | ICD-10-CM | POA: Diagnosis not present

## 2016-12-21 DIAGNOSIS — L239 Allergic contact dermatitis, unspecified cause: Secondary | ICD-10-CM | POA: Diagnosis not present

## 2016-12-21 DIAGNOSIS — D2271 Melanocytic nevi of right lower limb, including hip: Secondary | ICD-10-CM | POA: Diagnosis not present

## 2016-12-21 MED FILL — DESONIDE 0.05% OINTMENT: 0.05 | 14 days supply | Qty: 15 | Fill #0

## 2016-12-28 ENCOUNTER — Ambulatory Visit: Payer: 59 | Admitting: Obstetrics and Gynecology

## 2017-02-05 ENCOUNTER — Encounter: Payer: Self-pay | Admitting: Obstetrics and Gynecology

## 2017-02-05 DIAGNOSIS — M1812 Unilateral primary osteoarthritis of first carpometacarpal joint, left hand: Secondary | ICD-10-CM | POA: Diagnosis not present

## 2017-02-05 DIAGNOSIS — M25532 Pain in left wrist: Secondary | ICD-10-CM | POA: Diagnosis not present

## 2017-02-05 DIAGNOSIS — Z9889 Other specified postprocedural states: Secondary | ICD-10-CM | POA: Diagnosis not present

## 2017-02-07 ENCOUNTER — Telehealth: Payer: Self-pay

## 2017-02-07 NOTE — Telephone Encounter (Signed)
Left message to call Kyandre Okray at [phone]. 

## 2017-02-07 NOTE — Telephone Encounter (Signed)
Ok for Valtrex as you have outlined.

## 2017-02-07 NOTE — Telephone Encounter (Signed)
See telephone encounter dated with today's date.

## 2017-02-07 NOTE — Telephone Encounter (Signed)
Non-Urgent Medical Question  Message [phone]  From [name] To Nunzio Cobbs, MD Sent 02/05/2017 7:18 AM  Good Morning-  I hope you are well. At my last visit we discussed how I have experienced an increase in fever blisters this year and you mentioned an oral anti-viral medication that you could prescribe. This morning I have yet another fever blister and am very interested in the prescription (this is 3 this calendar year).  Please advise as to what I need to do in order to get the prescription.  Thank you-  Ashley Quinn   Responsible Party   Pool - [facility] Clinical Pool No one has taken responsibility for this message.  No actions have been taken on this message.   Dr.Silva, okay to send in Valtrex 1000 mg take 2 tablets once repeat in 12 hours #20 1RF?

## 2017-02-12 MED ORDER — VALACYCLOVIR HCL 1 G PO TABS: ORAL_TABLET | ORAL | 1 refills | Status: DC

## 2017-02-12 MED FILL — valACYclovir HCL 1 GM TABS: 1 | 5 days supply | Qty: 20 | Fill #0

## 2017-02-12 NOTE — Telephone Encounter (Signed)
Spoke with patient. Advised that rx for Valtrex 1000 MG take 2 tablets with onset of symptoms, then 2 tablets 12 hours later #20 1RF has been sent to pharmacy on file. Patient is agreeable. Encounter closed.

## 2017-03-21 DIAGNOSIS — M1812 Unilateral primary osteoarthritis of first carpometacarpal joint, left hand: Secondary | ICD-10-CM | POA: Diagnosis not present

## 2017-03-21 DIAGNOSIS — M79642 Pain in left hand: Secondary | ICD-10-CM | POA: Diagnosis not present

## 2017-04-30 ENCOUNTER — Ambulatory Visit
Admission: RE | Admit: 2017-04-30 | Discharge: 2017-04-30 | Disposition: A | Discharge status: Home / Self Care | Payer: 59 | Source: Ambulatory Visit | Attending: Sports Medicine | Admitting: Sports Medicine

## 2017-04-30 ENCOUNTER — Ambulatory Visit (INDEPENDENT_AMBULATORY_CARE_PROVIDER_SITE_OTHER): Payer: 59 | Admitting: Sports Medicine

## 2017-04-30 VITALS — BP 126/80 | HR 89 | Ht 70.0 in | Wt 153.0 lb

## 2017-04-30 DIAGNOSIS — M25511 Pain in right shoulder: Secondary | ICD-10-CM | POA: Diagnosis not present

## 2017-04-30 DIAGNOSIS — G8929 Other chronic pain: Secondary | ICD-10-CM | POA: Diagnosis not present

## 2017-04-30 MED ORDER — GABAPENTIN 300 MG PO CAPS: 300.0000 mg | ORAL_CAPSULE | Freq: Every day | ORAL | 1 refills | Status: DC

## 2017-04-30 MED FILL — GABAPENTIN 300 MG CAPSULE: 300 | 30 days supply | Qty: 30 | Fill #0

## 2017-04-30 NOTE — Progress Notes (Signed)
   Subjective:    Patient ID: Ashley Quinn, female    DOB: [birth year], 47 y.o.   MRN: [id number]  HPI complaint: Right shoulder pain  Very pleasant 47 year old left-hand-dominant female comes in today complaining of right shoulder pain that has been present since November. She does not recall any specific injury but rather describes a gradual onset of pain that is diffuse around the shoulder. It is most noticeable at night when trying to sleep. She has noticed that her pain is most pronounced when reaching in her backseat or reaching around behind her back. She does endorse some numbness and tingling. She denies pain with overhead reaching or reaching out to pick up heavy objects. She denies problems with her shoulder in the past. No prior shoulder surgeries. She's not had any imaging. No popping or catching.  Past medical history reviewed. 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 There is a strong family history of arthritis    Review of Systems As above    Objective:   Physical Exam  Well-developed, fit appearing. No acute distress. Awake alert and oriented 3. Vital signs reviewed  Right shoulder: Patient has excellent forward flexion and abduction without pain. She has full passive external rotation on the left but her right shoulder lacks about 10 of external rotation and does reproduce shoulder pain. She does have pain with internal rotation but ROM is full. No tenderness to palpation over the acromioclavicular joint nor over the bicipital groove. Negative empty can, negative Hawkins. Rotator cuff strength is 5/5 and does not reproduce pain. Negative O'Briens. Neurovascularly intact distally.       Assessment & Plan:   Right shoulder pain likely secondary to early adhesive capsulitis-rule out glenohumeral DJD  X-ray of the right shoulder specifically to rule out DJD. If unremarkable,  patient will start 300 mg of gabapentin at night and will start passive range of motion exercises at home. She will avoid aggravating activities in the gym and will follow-up with me in 3 weeks. If symptoms are not improving at that point then I may need to consider merits of further diagnostic imaging. Patient will call with questions or concerns in the interim.  Addendum: X-ray unremarkable. No evidence of osteoarthritis.

## 2017-05-11 DIAGNOSIS — H5213 Myopia, bilateral: Secondary | ICD-10-CM | POA: Diagnosis not present

## 2017-05-11 DIAGNOSIS — H52223 Regular astigmatism, bilateral: Secondary | ICD-10-CM | POA: Diagnosis not present

## 2017-05-22 ENCOUNTER — Ambulatory Visit (INDEPENDENT_AMBULATORY_CARE_PROVIDER_SITE_OTHER): Payer: 59 | Admitting: Sports Medicine

## 2017-05-22 VITALS — BP 98/68 | Ht 69.5 in | Wt 150.0 lb

## 2017-05-22 DIAGNOSIS — M7501 Adhesive capsulitis of right shoulder: Secondary | ICD-10-CM

## 2017-05-23 NOTE — Progress Notes (Signed)
   Subjective:    Patient ID: Ashley Quinn, female    DOB: 1970/12/11, 47 y.o.   MRN: [id number]  HPI   Patient comes in today for follow-up on right shoulder pain. Patient is doing better. She no longer has pain when reaching into her back seat. Still some slight pain at night but not terrible. She has been doing her range of motion exercises at home and has noticed some improvement. She was not able to tolerate her gabapentin. It caused too much drowsiness.   Review of Systems As above    Objective:   Physical Exam  Well-developed, well-nourished. No acute distress. Sitting comfortably in exam room. Vital signs reviewed  Right shoulder: Patient demonstrates full painless shoulder range of motion. She has full passive and active range of motion including external rotation and abduction. Negative empty can, negative Hawkins. Rotator cuff strength is 5/5 and does not reproduce pain. No tenderness to palpation. Neurovascularly intact distally.  X-rays of the right shoulder show no significant arthritic changes      Assessment & Plan:   Improving right shoulder pain likely secondary to early adhesive capsulitis.  Patient is doing well. I think she can return to the gym and increase activity as tolerated. Given her improvement I do not think we need to pursue further workup or treatment at this time. Patient will follow-up with me as needed.

## 2017-08-15 ENCOUNTER — Encounter: Payer: Self-pay | Admitting: Obstetrics and Gynecology

## 2017-08-15 ENCOUNTER — Ambulatory Visit (INDEPENDENT_AMBULATORY_CARE_PROVIDER_SITE_OTHER): Payer: 59 | Admitting: Obstetrics and Gynecology

## 2017-08-15 ENCOUNTER — Other Ambulatory Visit: Payer: Self-pay

## 2017-08-15 VITALS — BP 102/60 | HR 80 | Resp 16 | Ht 69.0 in | Wt 149.0 lb

## 2017-08-15 DIAGNOSIS — Z1211 Encounter for screening for malignant neoplasm of colon: Secondary | ICD-10-CM

## 2017-08-15 DIAGNOSIS — Z01419 Encounter for gynecological examination (general) (routine) without abnormal findings: Secondary | ICD-10-CM | POA: Diagnosis not present

## 2017-08-15 NOTE — Addendum Note (Signed)
Addended by: Terence Lux A on: 08/15/2017 10:56 AM   Modules accepted: Orders

## 2017-08-15 NOTE — Progress Notes (Signed)
47 y.o. G93P2004 Married Caucasian female here for annual exam.    Menses are lighter and shorter.  Feels cold a lot.  Decreased energy by 4:00 pm daily.  Does get up very early to go to the gym. Has lost weight through dietary change and exercise.  Using [place] and has more energy and feels better during her cycle.   PCP: No PCP   Patient's last menstrual period was 07/18/2017.           Sexually active: Yes.    The current method of family planning is vasectomy.    Exercising: Yes.    cardio, yoga, and strength Smoker:  no  Health Maintenance: Pap:  07-01-15 Neg:Neg HR HPV; 06-30-14 Neg  History of abnormal Pap:  Yes, 2002 AGUS Pap; 05/26/10 LGSIL colpo bx done atypia with HPV effect. MMG:  10/05/16 BIRADS 1 negative/density c Colonoscopy:  n/a BMD:   n/a  Result  n/a TDaP:  Up to date through [facility] [place]:   no HIV: in pregnancy - negative.  Hep C: never Screening Labs:  Discuss today -- patient is fasting   reports that she has never smoked. She has never used smokeless tobacco. She reports that she drinks about 1.8 - 2.4 oz of alcohol per week. She reports that she does not use drugs.  Past Medical History:  Diagnosis Date  . Abnormal Pap smear of cervix   . GERD (gastroesophageal reflux disease)    occ.  . Melanoma ([place]) 11/2012   left thigh  . PONV (postoperative nausea and vomiting)     Past Surgical History:  Procedure Laterality Date  . COLPOSCOPY  05/2010   Mild Dysplasia  . DILITATION & CURRETTAGE/HYSTROSCOPY WITH NOVASURE ABLATION N/A 11/28/2016   Procedure: DILATATION & CURETTAGE/HYSTEROSCOPY WITH ROLLER BALL ABLATION W/BIPOLAR CAUTERY AND ATTEMPTED NOVASURE ABLATION;  Surgeon: Nunzio Cobbs, MD;  Location: [facility];  Service: Gynecology;  Laterality: N/A;  . ENDOSCOPIC VEIN LASER TREATMENT Right 07/25/2016  . melanoma removal  11/2012   -left thigh  . WRIST FRACTURE SURGERY Left     Current Outpatient Medications  Medication Sig  Dispense Refill  . cetirizine (ZYRTEC) 10 MG tablet Take 10 mg by mouth daily as needed for allergies.     Marland Kitchen ibuprofen (ADVIL,MOTRIN) 800 MG tablet Take 1 tablet (800 mg total) by mouth every 8 (eight) hours as needed. 30 tablet 0  . Multiple Vitamin (MULTIVITAMIN) tablet Take 1 tablet by mouth daily.    . Probiotic Product (PROBIOTIC PO) Take 1 capsule by mouth daily.     . RESTASIS MULTIDOSE 0.05 % ophthalmic emulsion Place 1 drop into both eyes 2 (two) times daily.     . valACYclovir (VALTREX) 1000 MG tablet Take 2 tablets now, repeat in 12 hours. 20 tablet 1   No current facility-administered medications for this visit.     Family History  Problem Relation Age of Onset  . Breast cancer Mother   . Hypertension Mother   . Osteoporosis Mother   . Hyperlipidemia Father   . Prostate cancer Father   . Cancer Father        bladder ca  . Heart disease Paternal Grandmother     Review of Systems  Constitutional: Negative.   HENT: Negative.   Eyes: Negative.   Respiratory: Negative.   Cardiovascular: Negative.   Gastrointestinal: Negative.   Endocrine: Negative.   Genitourinary: Negative.   Musculoskeletal: Negative.   Skin: Negative.   Allergic/Immunologic: Negative.  Neurological: Negative.   Hematological: Negative.   Psychiatric/Behavioral: Negative.     Exam:   BP 102/60 (BP Location: Right Arm, Patient Position: Sitting, Cuff Size: Normal)   Pulse 80   Resp 16   Ht 5\' 9"  (1.753 m)   Wt 149 lb (67.6 kg)   LMP 07/18/2017   BMI 22.00 kg/m     General appearance: alert, cooperative and appears stated age Head: Normocephalic, without obvious abnormality, atraumatic Neck: no adenopathy, supple, symmetrical, trachea midline and thyroid normal to inspection and palpation Lungs: clear to auscultation bilaterally Breasts: normal appearance, no masses or tenderness, No nipple retraction or dimpling, No nipple discharge or bleeding, No axillary or supraclavicular  adenopathy Heart: regular rate and rhythm Abdomen: soft, non-tender; no masses, no organomegaly Extremities: extremities normal, atraumatic, no cyanosis or edema Skin: Skin color, texture, turgor normal. No rashes or lesions Lymph nodes: Cervical, supraclavicular, and axillary nodes normal. No abnormal inguinal nodes palpated Neurologic: Grossly normal  Pelvic: External genitalia:  no lesions              Urethra:  normal appearing urethra with no masses, tenderness or lesions              Bartholins and Skenes: normal                 Vagina: normal appearing vagina with normal color and discharge, no lesions              Cervix: no lesions              Pap taken: No. Bimanual Exam:  Uterus:  normal size, contour, position, consistency, mobility, non-tender              Adnexa: no mass, fullness, tenderness              Rectal exam: Yes.  .  Confirms.              Anus:  normal sphincter tone, no lesions  Chaperone was present for exam.  Assessment:   Well woman visit with normal exam. Hx prior LGSIL.  Status post endometrial ablation.  Attempted Novasure and then conversion to roller ball.  HSV 1.   Plan: Mammogram screening. Recommended self breast awareness. Pap and HR HPV as above. Guidelines for Calcium, Vitamin D, regular exercise program including cardiovascular and weight bearing exercise. Routine labs.  IFOB.  I discussed [place] Follow up annually and prn.   After visit summary provided.

## 2017-08-15 NOTE — Patient Instructions (Signed)

## 2017-08-16 LAB — COMPREHENSIVE METABOLIC PANEL
A/G RATIO: 1.8 (ref 1.2–2.2)
ALK PHOS: 63 IU/L (ref 39–117)
ALT: 14 IU/L (ref 0–32)
AST: 21 IU/L (ref 0–40)
Albumin: 4.5 g/dL (ref 3.5–5.5)
BILIRUBIN TOTAL: 0.3 mg/dL (ref 0.0–1.2)
BUN / CREAT RATIO: 19 (ref 9–23)
BUN: 16 mg/dL (ref 6–24)
CHLORIDE: 106 mmol/L (ref 96–106)
CO2: 23 mmol/L (ref 20–29)
Calcium: 9.2 mg/dL (ref 8.7–10.2)
Creatinine, Ser: 0.83 mg/dL (ref 0.57–1.00)
GFR calc Af Amer: 97 mL/min/{1.73_m2} (ref >59)
GFR calc non Af Amer: 84 mL/min/{1.73_m2} (ref >59)
GLUCOSE: 79 mg/dL (ref 65–99)
Globulin, Total: 2.5 g/dL (ref 1.5–4.5)
Potassium: 5 mmol/L (ref 3.5–5.2)
Sodium: 141 mmol/L (ref 134–144)
Total Protein: 7 g/dL (ref 6.0–8.5)

## 2017-08-16 LAB — CBC
Hematocrit: 41.8 % (ref 34.0–46.6)
Hemoglobin: 13.3 g/dL (ref 11.1–15.9)
MCH: 26.2 pg — AB (ref 26.6–33.0)
MCHC: 31.8 g/dL (ref 31.5–35.7)
MCV: 82 fL (ref 79–97)
Platelets: 196 10*3/uL (ref 150–379)
RBC: 5.07 x10E6/uL (ref 3.77–5.28)
RDW: 15.6 % — AB (ref 12.3–15.4)
WBC: 5.1 10*3/uL (ref 3.4–10.8)

## 2017-08-16 LAB — LIPID PANEL
CHOL/HDL RATIO: 2.1 ratio (ref 0.0–4.4)
Cholesterol, Total: 194 mg/dL (ref 100–199)
HDL: 94 mg/dL (ref >39)
LDL CALC: 92 mg/dL (ref 0–99)
TRIGLYCERIDES: 40 mg/dL (ref 0–149)
VLDL CHOLESTEROL CAL: 8 mg/dL (ref 5–40)

## 2017-08-16 LAB — VITAMIN D 25 HYDROXY (VIT D DEFICIENCY, FRACTURES): VIT D 25 HYDROXY: 37 ng/mL (ref 30.0–100.0)

## 2017-08-16 LAB — TSH: TSH: 0.901 u[IU]/mL (ref 0.450–4.500)

## 2017-10-12 ENCOUNTER — Other Ambulatory Visit: Payer: Self-pay | Admitting: Obstetrics and Gynecology

## 2017-10-12 DIAGNOSIS — Z1231 Encounter for screening mammogram for malignant neoplasm of breast: Secondary | ICD-10-CM

## 2017-10-26 ENCOUNTER — Ambulatory Visit: Payer: 59

## 2017-11-14 ENCOUNTER — Ambulatory Visit
Admission: RE | Admit: 2017-11-14 | Discharge: 2017-11-14 | Disposition: A | Discharge status: Home / Self Care | Payer: 59 | Source: Ambulatory Visit | Attending: Obstetrics and Gynecology | Admitting: Obstetrics and Gynecology

## 2017-11-14 DIAGNOSIS — Z1231 Encounter for screening mammogram for malignant neoplasm of breast: Secondary | ICD-10-CM

## 2017-12-23 ENCOUNTER — Telehealth: Payer: Self-pay | Admitting: Obstetrics and Gynecology

## 2017-12-23 NOTE — Telephone Encounter (Signed)
Please contact patient to remind her to send her IFOB in to the lab.  She came up in my reminder box in Epic.  

## 2017-12-24 NOTE — Telephone Encounter (Signed)
Reminded patient to do IFOB.  Patients states she will do this soon.

## 2018-01-29 DIAGNOSIS — D1801 Hemangioma of skin and subcutaneous tissue: Secondary | ICD-10-CM | POA: Diagnosis not present

## 2018-01-29 DIAGNOSIS — D485 Neoplasm of uncertain behavior of skin: Secondary | ICD-10-CM | POA: Diagnosis not present

## 2018-01-29 DIAGNOSIS — D2271 Melanocytic nevi of right lower limb, including hip: Secondary | ICD-10-CM | POA: Diagnosis not present

## 2018-01-29 DIAGNOSIS — L814 Other melanin hyperpigmentation: Secondary | ICD-10-CM | POA: Diagnosis not present

## 2018-01-29 DIAGNOSIS — L821 Other seborrheic keratosis: Secondary | ICD-10-CM | POA: Diagnosis not present

## 2018-01-29 DIAGNOSIS — Z85820 Personal history of malignant melanoma of skin: Secondary | ICD-10-CM | POA: Diagnosis not present

## 2018-01-29 DIAGNOSIS — D225 Melanocytic nevi of trunk: Secondary | ICD-10-CM | POA: Diagnosis not present

## 2018-02-09 DIAGNOSIS — Z1211 Encounter for screening for malignant neoplasm of colon: Secondary | ICD-10-CM | POA: Diagnosis not present

## 2018-02-21 LAB — FECAL OCCULT BLOOD, IMMUNOCHEMICAL: FECAL OCCULT BLD: NEGATIVE

## 2018-04-10 DIAGNOSIS — D649 Anemia, unspecified: Secondary | ICD-10-CM

## 2018-04-10 HISTORY — DX: Anemia, unspecified: D64.9

## 2018-05-30 DIAGNOSIS — D2271 Melanocytic nevi of right lower limb, including hip: Secondary | ICD-10-CM | POA: Diagnosis not present

## 2018-05-30 DIAGNOSIS — Z85820 Personal history of malignant melanoma of skin: Secondary | ICD-10-CM | POA: Diagnosis not present

## 2018-09-04 DIAGNOSIS — H5213 Myopia, bilateral: Secondary | ICD-10-CM | POA: Diagnosis not present

## 2018-09-16 ENCOUNTER — Other Ambulatory Visit: Payer: Self-pay

## 2018-09-18 ENCOUNTER — Other Ambulatory Visit: Payer: Self-pay

## 2018-09-18 ENCOUNTER — Other Ambulatory Visit (HOSPITAL_COMMUNITY)
Admission: RE | Admit: 2018-09-18 | Discharge: 2018-09-18 | Disposition: A | Discharge status: Home / Self Care | Payer: 59 | Source: Ambulatory Visit | Attending: Obstetrics and Gynecology | Admitting: Obstetrics and Gynecology

## 2018-09-18 ENCOUNTER — Encounter: Payer: Self-pay | Admitting: Obstetrics and Gynecology

## 2018-09-18 ENCOUNTER — Ambulatory Visit (INDEPENDENT_AMBULATORY_CARE_PROVIDER_SITE_OTHER): Payer: 59 | Admitting: Obstetrics and Gynecology

## 2018-09-18 ENCOUNTER — Ambulatory Visit: Payer: 59 | Admitting: Obstetrics and Gynecology

## 2018-09-18 VITALS — BP 102/66 | HR 80 | Temp 97.1°F | Resp 14 | Ht 69.75 in | Wt 153.0 lb

## 2018-09-18 DIAGNOSIS — Z1211 Encounter for screening for malignant neoplasm of colon: Secondary | ICD-10-CM

## 2018-09-18 DIAGNOSIS — Z01419 Encounter for gynecological examination (general) (routine) without abnormal findings: Secondary | ICD-10-CM

## 2018-09-18 DIAGNOSIS — D649 Anemia, unspecified: Secondary | ICD-10-CM | POA: Diagnosis not present

## 2018-09-18 MED ORDER — VALACYCLOVIR HCL 1 G PO TABS: ORAL_TABLET | ORAL | 1 refills | Status: DC

## 2018-09-18 MED FILL — valACYclovir HCL 1 GM TABS: 1 | 10 days supply | Qty: 20 | Fill #0

## 2018-09-18 NOTE — Patient Instructions (Signed)

## 2018-09-18 NOTE — Progress Notes (Signed)
48 y.o. G80P2002 Married Caucasian female here for annual exam.    Menses last 3 days.  Flow starts heavy and tapers off quickly.  Not having cramping.  Menses are more manageable since her endometrial ablation.   Taking OTC Estroven which she feels helps her mood.   Wants routine labs.   PCP: No PCP     Patient's last menstrual period was 09/15/2018.           Sexually active: Yes.    The current method of family planning is vasectomy.    Exercising: Yes.    carido and strength training Smoker:  no  Health Maintenance: Pap:   07-01-15 Neg:Neg HR HPV; 06-30-14 Neg History of abnormal Pap:  Yes,2002 AGUS Pap; 05/26/10 LGSIL colpo bx done atypia with HPV effect MMG:  11/14/17 BIRADS 1 negative/density c TDaP:  Unsure.  Done through [facility].  She will check with [facility].  Gardasil:   no HIV: negative in pregnancy Hep C: never Screening Labs:  Discuss today   reports that she has never smoked. She has never used smokeless tobacco. She reports current alcohol use of about 3.0 - 4.0 standard drinks of alcohol per week. She reports that she does not use drugs.  Past Medical History:  Diagnosis Date  . Abnormal Pap smear of cervix   . GERD (gastroesophageal reflux disease)    occ.  Marland Kitchen HSV-1 (herpes simplex virus 1) infection   . Melanoma ([place]) 11/2012   left thigh  . PONV (postoperative nausea and vomiting)     Past Surgical History:  Procedure Laterality Date  . COLPOSCOPY  05/2010   Mild Dysplasia  . DILITATION & CURRETTAGE/HYSTROSCOPY WITH NOVASURE ABLATION N/A 11/28/2016   Procedure: DILATATION & CURETTAGE/HYSTEROSCOPY WITH ROLLER BALL ABLATION W/BIPOLAR CAUTERY AND ATTEMPTED NOVASURE ABLATION;  Surgeon: Nunzio Cobbs, MD;  Location: [facility];  Service: Gynecology;  Laterality: N/A;  . ENDOSCOPIC VEIN LASER TREATMENT Right 07/25/2016  . melanoma removal  11/2012   -left thigh  . WRIST FRACTURE SURGERY Left     Current Outpatient Medications  Medication Sig  Dispense Refill  . cetirizine (ZYRTEC) 10 MG tablet Take 10 mg by mouth daily as needed for allergies.     Marland Kitchen ibuprofen (ADVIL,MOTRIN) 800 MG tablet Take 1 tablet (800 mg total) by mouth every 8 (eight) hours as needed. 30 tablet 0  . Multiple Vitamin (MULTIVITAMIN) tablet Take 1 tablet by mouth daily.    . Probiotic Product (PROBIOTIC PO) Take 1 capsule by mouth daily.     . RESTASIS MULTIDOSE 0.05 % ophthalmic emulsion Place 1 drop into both eyes 2 (two) times daily.     . valACYclovir (VALTREX) 1000 MG tablet Take 2 tablets now, repeat in 12 hours. 20 tablet 1   No current facility-administered medications for this visit.     Family History  Problem Relation Age of Onset  . Hypertension Mother   . Osteoporosis Mother   . Breast cancer Mother        postmenopausal, age 36  . Hyperlipidemia Father   . Prostate cancer Father   . Cancer Father        bladder ca  . Heart disease Paternal Grandmother     Review of Systems  Constitutional: Negative.   HENT: Negative.   Eyes: Negative.   Respiratory: Negative.   Cardiovascular: Negative.   Gastrointestinal: Negative.   Endocrine: Negative.   Genitourinary: Negative.   Musculoskeletal: Negative.   Skin: Negative.  Allergic/Immunologic: Negative.   Neurological: Negative.   Hematological: Negative.   Psychiatric/Behavioral: Negative.     Exam:   BP 102/66 (BP Location: Left Arm, Patient Position: Sitting, Cuff Size: Normal)   Pulse 80   Temp (!) 97.1 F (36.2 C) (Temporal)   Resp 14   Ht 5' 9.75" (1.772 m)   Wt 153 lb (69.4 kg)   LMP 09/15/2018   BMI 22.11 kg/m     General appearance: alert, cooperative and appears stated age Head: normocephalic, without obvious abnormality, atraumatic Neck: no adenopathy, supple, symmetrical, trachea midline and thyroid normal to inspection and palpation Lungs: clear to auscultation bilaterally Breasts: normal appearance, no masses or tenderness, No nipple retraction or dimpling, No  nipple discharge or bleeding, No axillary adenopathy Heart: regular rate and rhythm Abdomen: soft, non-tender; no masses, no organomegaly Extremities: extremities normal, atraumatic, no cyanosis or edema Skin: skin color, texture, turgor normal. No rashes or lesions Lymph nodes: cervical, supraclavicular, and axillary nodes normal. Neurologic: grossly normal  Pelvic: External genitalia:  no lesions              No abnormal inguinal nodes palpated.              Urethra:  normal appearing urethra with no masses, tenderness or lesions              Bartholins and Skenes: normal                 Vagina: normal appearing vagina with normal color and discharge, no lesions              Cervix: no lesions              Pap taken: Yes.   Bimanual Exam:  Uterus:  normal size, contour, position, consistency, mobility, non-tender              Adnexa: no mass, fullness, tenderness              Rectal exam: Yes.  .  Confirms.              Anus:  normal sphincter tone, no lesions  Chaperone was present for exam.  Assessment:   Well woman visit with normal exam. Hx prior LGSIL.  Status post endometrial ablation.  Attempted Novasure and then conversion to roller ball.  HSV 1.  FH breast cancer in mother.   Plan: Mammogram screening discussed. Self breast awareness reviewed. Pap and HR HPV as above. Guidelines for Calcium, Vitamin D, regular exercise program including cardiovascular and weight bearing exercise. IFOB.  Routine labs.  Follow up annually and prn.   After visit summary provided.

## 2018-09-19 LAB — COMPREHENSIVE METABOLIC PANEL
ALT: 15 IU/L (ref 0–32)
AST: 21 IU/L (ref 0–40)
Albumin/Globulin Ratio: 1.9 (ref 1.2–2.2)
Albumin: 4.4 g/dL (ref 3.8–4.8)
Alkaline Phosphatase: 58 IU/L (ref 39–117)
BUN/Creatinine Ratio: 21 (ref 9–23)
BUN: 14 mg/dL (ref 6–24)
Bilirubin Total: 0.4 mg/dL (ref 0.0–1.2)
CO2: 23 mmol/L (ref 20–29)
Calcium: 9.2 mg/dL (ref 8.7–10.2)
Chloride: 103 mmol/L (ref 96–106)
Creatinine, Ser: 0.68 mg/dL (ref 0.57–1.00)
GFR calc Af Amer: 120 mL/min/{1.73_m2} (ref >59)
GFR calc non Af Amer: 104 mL/min/{1.73_m2} (ref >59)
Globulin, Total: 2.3 g/dL (ref 1.5–4.5)
Glucose: 86 mg/dL (ref 65–99)
Potassium: 4.7 mmol/L (ref 3.5–5.2)
Sodium: 137 mmol/L (ref 134–144)
Total Protein: 6.7 g/dL (ref 6.0–8.5)

## 2018-09-19 LAB — CBC
Hematocrit: 35.1 % (ref 34.0–46.6)
Hemoglobin: 10.6 g/dL — ABNORMAL LOW (ref 11.1–15.9)
MCH: 23.2 pg — ABNORMAL LOW (ref 26.6–33.0)
MCHC: 30.2 g/dL — ABNORMAL LOW (ref 31.5–35.7)
MCV: 77 fL — ABNORMAL LOW (ref 79–97)
Platelets: 196 10*3/uL (ref 150–450)
RBC: 4.57 x10E6/uL (ref 3.77–5.28)
RDW: 14.7 % (ref 11.7–15.4)
WBC: 3.3 10*3/uL — ABNORMAL LOW (ref 3.4–10.8)

## 2018-09-19 LAB — LIPID PANEL
Chol/HDL Ratio: 2.1 ratio (ref 0.0–4.4)
Cholesterol, Total: 188 mg/dL (ref 100–199)
HDL: 88 mg/dL (ref >39)
LDL Calculated: 93 mg/dL (ref 0–99)
Triglycerides: 36 mg/dL (ref 0–149)
VLDL Cholesterol Cal: 7 mg/dL (ref 5–40)

## 2018-09-20 ENCOUNTER — Encounter: Payer: Self-pay | Admitting: Obstetrics and Gynecology

## 2018-09-20 LAB — CYTOLOGY - PAP
Diagnosis: NEGATIVE
HPV: NOT DETECTED

## 2018-09-23 ENCOUNTER — Other Ambulatory Visit: Payer: Self-pay | Admitting: *Deleted

## 2018-09-23 DIAGNOSIS — D649 Anemia, unspecified: Secondary | ICD-10-CM

## 2018-09-23 LAB — FERRITIN: Ferritin: 7 ng/mL — ABNORMAL LOW (ref 15–150)

## 2018-09-23 LAB — IRON: Iron: 27 ug/dL (ref 27–159)

## 2018-09-23 LAB — SPECIMEN STATUS REPORT

## 2018-10-07 MED FILL — RESTASIS 0.05% EYE EMULSION: 0.05 | 90 days supply | Qty: 180 | Fill #0

## 2018-10-25 ENCOUNTER — Other Ambulatory Visit: Payer: Self-pay | Admitting: Family

## 2018-10-25 DIAGNOSIS — D649 Anemia, unspecified: Secondary | ICD-10-CM

## 2018-10-28 ENCOUNTER — Inpatient Hospital Stay: Payer: 59 | Attending: Family | Admitting: Family

## 2018-10-28 ENCOUNTER — Inpatient Hospital Stay: Payer: 59

## 2018-10-28 ENCOUNTER — Other Ambulatory Visit: Payer: Self-pay

## 2018-10-28 DIAGNOSIS — Z79899 Other long term (current) drug therapy: Secondary | ICD-10-CM | POA: Insufficient documentation

## 2018-10-28 DIAGNOSIS — Z85820 Personal history of malignant melanoma of skin: Secondary | ICD-10-CM | POA: Diagnosis not present

## 2018-10-28 DIAGNOSIS — N920 Excessive and frequent menstruation with regular cycle: Secondary | ICD-10-CM

## 2018-10-28 DIAGNOSIS — Z803 Family history of malignant neoplasm of breast: Secondary | ICD-10-CM | POA: Insufficient documentation

## 2018-10-28 DIAGNOSIS — D509 Iron deficiency anemia, unspecified: Secondary | ICD-10-CM | POA: Diagnosis not present

## 2018-10-28 DIAGNOSIS — D649 Anemia, unspecified: Secondary | ICD-10-CM

## 2018-10-28 LAB — CBC WITH DIFFERENTIAL (CANCER CENTER ONLY)
Abs Immature Granulocytes: 0.05 10*3/uL (ref 0.00–0.07)
Basophils Absolute: 0 10*3/uL (ref 0.0–0.1)
Basophils Relative: 0 %
Eosinophils Absolute: 0.1 10*3/uL (ref 0.0–0.5)
Eosinophils Relative: 1 %
HCT: 44.6 % (ref 36.0–46.0)
Hemoglobin: 13.7 g/dL (ref 12.0–15.0)
Immature Granulocytes: 1 %
Lymphocytes Relative: 22 %
Lymphs Abs: 1.7 10*3/uL (ref 0.7–4.0)
MCH: 25 pg — ABNORMAL LOW (ref 26.0–34.0)
MCHC: 30.7 g/dL (ref 30.0–36.0)
MCV: 81.2 fL (ref 80.0–100.0)
Monocytes Absolute: 0.5 10*3/uL (ref 0.1–1.0)
Monocytes Relative: 6 %
Neutro Abs: 5.5 10*3/uL (ref 1.7–7.7)
Neutrophils Relative %: 70 %
Platelet Count: 219 10*3/uL (ref 150–400)
RBC: 5.49 MIL/uL — ABNORMAL HIGH (ref 3.87–5.11)
RDW: 23.7 % — ABNORMAL HIGH (ref 11.5–15.5)
WBC Count: 7.9 10*3/uL (ref 4.0–10.5)
nRBC: 0 % (ref 0.0–0.2)

## 2018-10-28 LAB — CMP (CANCER CENTER ONLY)
ALT: 17 U/L (ref 0–44)
AST: 22 U/L (ref 15–41)
Albumin: 4.6 g/dL (ref 3.5–5.0)
Alkaline Phosphatase: 61 U/L (ref 38–126)
Anion gap: 9 (ref 5–15)
BUN: 17 mg/dL (ref 6–20)
CO2: 28 mmol/L (ref 22–32)
Calcium: 9.1 mg/dL (ref 8.9–10.3)
Chloride: 101 mmol/L (ref 98–111)
Creatinine: 0.7 mg/dL (ref 0.44–1.00)
GFR, Est AFR Am: >60 mL/min (ref >60)
GFR, Estimated: >60 mL/min (ref >60)
Glucose, Bld: 97 mg/dL (ref 70–99)
Potassium: 3.9 mmol/L (ref 3.5–5.1)
Sodium: 138 mmol/L (ref 135–145)
Total Bilirubin: 0.7 mg/dL (ref 0.3–1.2)
Total Protein: 7.1 g/dL (ref 6.5–8.1)

## 2018-10-28 LAB — RETICULOCYTES
Immature Retic Fract: 11.4 % (ref 2.3–15.9)
RBC.: 5.33 MIL/uL — ABNORMAL HIGH (ref 3.87–5.11)
Retic Count, Absolute: 105 10*3/uL (ref 19.0–186.0)
Retic Ct Pct: 2 % (ref 0.4–3.1)

## 2018-10-28 LAB — SAVE SMEAR (SSMR)

## 2018-10-28 NOTE — Progress Notes (Addendum)
Hematology/Oncology Consultation   Name: Ashley Quinn      MRN: [id number]    Location: Room/bed info not found  Date: 10/28/2018 Time:1:54 PM   REFERRING PHYSICIAN: Synthia Innocent, MD  REASON FOR CONSULT: Anemia    DIAGNOSIS: iron deficiency anemia   HISTORY OF PRESENT ILLNESS: Ms. Ashley Quinn is a very pleasant 48 yo caucasian female with recent diagnosis of iron deficiency anemia which she feels has been an underlying issues for a while now. Her ferritin in June was 7, iron 27 and Hgb 10.6.  She started taking an over the counter iron supplement in June and has tolerated this well. Her Hgb is now 13.7. Iron studies are pending.  She has had some mild fatigue in the afternoons.  She had a uterine ablation in 2018 due to heavy cycles. She is off of oral contraception and her cycle is regular and not heavy lasting 3 days.  She has not noted any other obvious blood loss. No bruising or petechiae.  She has had wrist surgery and her wisdom teeth extracted without any complications.  She has 2 children and no history of miscarriage.  She a melanoma removed in 2016 and states that all her checks since then have been negative. She follows up with dermatology now annually.  Family history of cancer includes maternal grandfather - leukemia, maternal grandmother - bladder, mother - breast and father bladder and prostate.  No fever, chills, n/v, cough, rash, dizziness, SOB, chest pain, palpitations, abdominal pain or changes in bowel or bladder habits.  No swelling, tenderness, numbness or tingling in her extremities.  She states that she does not eat a lot of red meat. She is not vegetarian and has a healthy appetite. She is staying well hydrated and describes her weight as stable.  She is not a smoker and only has the occasional alcoholic beverage socially.  She is active and enjoys doing with cardio workouts and strength training. She also does gentle yoga.  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   ROS: All other 10 point review of systems is negative.   PAST MEDICAL HISTORY:   Past Medical History:  Diagnosis Date  . Abnormal Pap smear of cervix   . Anemia 2020  . GERD (gastroesophageal reflux disease)    occ.  Marland Kitchen HSV-1 (herpes simplex virus 1) infection   . Melanoma ([place]) 11/2012   left thigh  . PONV (postoperative nausea and vomiting)     ALLERGIES: No Known Allergies    MEDICATIONS:  Current Outpatient Medications on File Prior to Visit  Medication Sig Dispense Refill  . cetirizine (ZYRTEC) 10 MG tablet Take 10 mg by mouth daily as needed for allergies.     Marland Kitchen ibuprofen (ADVIL,MOTRIN) 800 MG tablet Take 1 tablet (800 mg total) by mouth every 8 (eight) hours as needed. 30 tablet 0  . Multiple Vitamin (MULTIVITAMIN) tablet Take 1 tablet by mouth daily.    . Probiotic Product (PROBIOTIC PO) Take 1 capsule by mouth daily.     . RESTASIS MULTIDOSE 0.05 % ophthalmic emulsion Place 1 drop into both eyes 2 (two) times daily.     . valACYclovir (VALTREX) 1000 MG tablet Take 2 tablets now, repeat in 12 hours as needed for an outbreak. 20 tablet 1   No current facility-administered medications on file prior to visit.      PAST SURGICAL HISTORY Past Surgical History:  Procedure Laterality Date  . COLPOSCOPY  05/2010   Mild Dysplasia  . DILITATION & CURRETTAGE/HYSTROSCOPY WITH NOVASURE ABLATION N/A 11/28/2016   Procedure: DILATATION & CURETTAGE/HYSTEROSCOPY WITH ROLLER BALL ABLATION W/BIPOLAR CAUTERY AND ATTEMPTED NOVASURE ABLATION;  Surgeon: Nunzio Cobbs, MD;  Location: [facility];  Service: Gynecology;  Laterality: N/A;  . ENDOSCOPIC VEIN LASER TREATMENT Right 07/25/2016  . melanoma removal  11/2012   -left thigh  . WRIST FRACTURE SURGERY Left     FAMILY HISTORY: Family History  Problem Relation Age of Onset  . Hypertension Mother   . Osteoporosis Mother   . Breast cancer Mother        postmenopausal, age 27  . Hyperlipidemia  Father   . Prostate cancer Father   . Cancer Father        bladder ca  . Heart disease Paternal Grandmother     SOCIAL HISTORY:  reports that she has never smoked. She has never used smokeless tobacco. She reports current alcohol use of about 3.0 - 4.0 standard drinks of alcohol per week. She reports that she does not use drugs.  PERFORMANCE STATUS: The patient's performance status is 1 - Symptomatic but completely ambulatory  PHYSICAL EXAM: Most Recent Vital Signs: There were no vitals taken for this visit. There were no vitals taken for this visit.  General Appearance:    Alert, cooperative, no distress, appears stated age  Head:    Normocephalic, without obvious abnormality, atraumatic  Eyes:    PERRL, conjunctiva/corneas clear, EOM's intact, fundi    benign, both eyes        Throat:   Lips, mucosa, and tongue normal; teeth and gums normal  Neck:   Supple, symmetrical, trachea midline, no adenopathy;    thyroid:  no enlargement/tenderness/nodules; no carotid   bruit or JVD  Back:     Symmetric, no curvature, ROM normal, no CVA tenderness  Lungs:     Clear to auscultation bilaterally, respirations unlabored  Chest Wall:    No tenderness or deformity   Heart:    Regular rate and rhythm, S1 and S2 normal, no murmur, rub   or gallop     Abdomen:     Soft, non-tender, bowel sounds active all four quadrants,    no masses, no organomegaly        Extremities:   Extremities normal, atraumatic, no cyanosis or edema  Pulses:   2+ and symmetric all extremities  Skin:   Skin color, texture, turgor normal, no rashes or lesions  Lymph nodes:   Cervical, supraclavicular, and axillary nodes normal  Neurologic:   CNII-XII intact, normal strength, sensation and reflexes    throughout    LABORATORY DATA:  Results for orders placed or performed in visit on 10/28/18 (from the past 48 hour(s))  Save Smear (SSMR)     Status: None   Collection Time: 10/28/18  1:28 PM  Result Value Ref Range    Smear Review SMEAR STAINED AND AVAILABLE FOR REVIEW     Comment: Performed at [facility] at [facility], [address] [place], [place] [address]  CBC with Differential (Cancer Center Only)     Status: Abnormal   Collection Time: 10/28/18  1:28 PM  Result Value Ref Range   WBC Count 7.9 4.0 - 10.5 K/uL   RBC 5.49 (H) 3.87 - 5.11 MIL/uL   Hemoglobin 13.7 12.0 - 15.0 g/dL   HCT 44.6 36.0 - 46.0 %   MCV 81.2 80.0 - 100.0 fL  MCH 25.0 (L) 26.0 - 34.0 pg   MCHC 30.7 30.0 - 36.0 g/dL   RDW 23.7 (H) 11.5 - 15.5 %   Platelet Count 219 150 - 400 K/uL   nRBC 0.0 0.0 - 0.2 %   Neutrophils Relative % 70 %   Neutro Abs 5.5 1.7 - 7.7 K/uL   Lymphocytes Relative 22 %   Lymphs Abs 1.7 0.7 - 4.0 K/uL   Monocytes Relative 6 %   Monocytes Absolute 0.5 0.1 - 1.0 K/uL   Eosinophils Relative 1 %   Eosinophils Absolute 0.1 0.0 - 0.5 K/uL   Basophils Relative 0 %   Basophils Absolute 0.0 0.0 - 0.1 K/uL   Immature Granulocytes 1 %   Abs Immature Granulocytes 0.05 0.00 - 0.07 K/uL    Comment: Performed at [facility] Lab at [facility], [address] [place], [place] [address]  Reticulocytes     Status: Abnormal   Collection Time: 10/28/18  1:29 PM  Result Value Ref Range   Retic Ct Pct 2.0 0.4 - 3.1 %   RBC. 5.33 (H) 3.87 - 5.11 MIL/uL   Retic Count, Absolute 105.0 19.0 - 186.0 K/uL   Immature Retic Fract 11.4 2.3 - 15.9 %    Comment: Performed at [facility] at [facility], [address] [place],  [address]      RADIOGRAPHY: No results found.     PATHOLOGY: None  ASSESSMENT/PLAN: Ms. Ashley Quinn is a very pleasant 48 yo caucasian female with recent diagnosis of iron deficiency anemia.  She has responded quite well to the oral iron and can now go to taking one tablet twice a week.  At this point we do not need to see her back in her our office but are available as needed if she should have any heme/onc  issues in the future.   All questions were answered and she is in agreement with the plan. She will contact our office with any questions or concerns. We can certainly see her sooner if needed.   She was discussed with and also seen by Dr. Marin Olp and he is in agreement with the aforementioned.   Laverna Peace, NP    Addendum:   I saw and examined the patient with Avani Sensabaugh.  I agree with her above assessment.  I looked at the blood smear for Ms. Guyer.  She has a normochromic and normocytic population of red blood cells.  There may be a couple hypochromic red blood cells.  I see no schistocytes or spherocytes.  There is no rouleaux formation.  There is no atypical monocytes or lymphocytes.  She has no hypersegmented polys.  There is no unusual platelet formation.  She clearly is responding to oral iron.  I am sure her iron levels were probably a little bit low but better.  Her MCV is on the low end of normal.  She is asymptomatic.  She is incredibly interesting to talk to.  She played basketball in high school.  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  We had a lot of fun talking about that.  I just do not think that we have to get her back to the office.  Again her hemoglobin is normal.  She is responded to the oral iron.  She is having no problems with oral iron.  I am just not sure what we are going to add to  her medical care.  We spent about 45 minutes with Ms. Ashley Quinn today.  We answered all of her questions.  We reviewed her lab work with her.  Of note, she does work for the [employer] care system.  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  I know that she is incredibly busy given all the changes going on in society now.  Lattie Haw, MD

## 2018-10-29 ENCOUNTER — Telehealth: Payer: Self-pay | Admitting: Family

## 2018-10-29 LAB — IRON AND TIBC
Iron: 317 ug/dL — ABNORMAL HIGH (ref 41–142)
Saturation Ratios: 81 % — ABNORMAL HIGH (ref 21–57)
TIBC: 389 ug/dL (ref 236–444)
UIBC: 72 ug/dL — ABNORMAL LOW (ref 120–384)

## 2018-10-29 LAB — ERYTHROPOIETIN: Erythropoietin: 12.2 m[IU]/mL (ref 2.6–18.5)

## 2018-10-29 LAB — FERRITIN: Ferritin: 40 ng/mL (ref 11–307)

## 2018-10-29 LAB — LACTATE DEHYDROGENASE: LDH: 232 U/L — ABNORMAL HIGH (ref 98–192)

## 2018-10-29 NOTE — Telephone Encounter (Signed)
I spoke with Ms. Ashley Quinn and went over her lab work from yesterday. She has done very well on oral iron and her counts are now well within normal limits. She can now take her iron supplement twice a week. We can now just follow up with her as needed. She will contact our office for any future heme/onc needs that may arise. We are happy to help!

## 2018-10-29 NOTE — Telephone Encounter (Signed)
No los 7/20  

## 2018-11-05 ENCOUNTER — Ambulatory Visit: Payer: 59 | Admitting: Obstetrics and Gynecology

## 2018-11-20 ENCOUNTER — Other Ambulatory Visit: Payer: Self-pay | Admitting: Obstetrics and Gynecology

## 2018-11-20 DIAGNOSIS — Z1231 Encounter for screening mammogram for malignant neoplasm of breast: Secondary | ICD-10-CM

## 2019-01-06 ENCOUNTER — Ambulatory Visit
Admission: RE | Admit: 2019-01-06 | Discharge: 2019-01-06 | Disposition: A | Discharge status: Home / Self Care | Payer: 59 | Source: Ambulatory Visit | Attending: Obstetrics and Gynecology | Admitting: Obstetrics and Gynecology

## 2019-01-06 ENCOUNTER — Other Ambulatory Visit: Payer: Self-pay

## 2019-01-06 DIAGNOSIS — Z1231 Encounter for screening mammogram for malignant neoplasm of breast: Secondary | ICD-10-CM

## 2019-02-12 DIAGNOSIS — D1801 Hemangioma of skin and subcutaneous tissue: Secondary | ICD-10-CM | POA: Diagnosis not present

## 2019-02-12 DIAGNOSIS — Z85820 Personal history of malignant melanoma of skin: Secondary | ICD-10-CM | POA: Diagnosis not present

## 2019-02-12 DIAGNOSIS — L821 Other seborrheic keratosis: Secondary | ICD-10-CM | POA: Diagnosis not present

## 2019-02-12 DIAGNOSIS — D2262 Melanocytic nevi of left upper limb, including shoulder: Secondary | ICD-10-CM | POA: Diagnosis not present

## 2019-02-12 DIAGNOSIS — D225 Melanocytic nevi of trunk: Secondary | ICD-10-CM | POA: Diagnosis not present

## 2019-02-20 ENCOUNTER — Telehealth: Payer: 59 | Admitting: Emergency Medicine

## 2019-02-20 DIAGNOSIS — R21 Rash and other nonspecific skin eruption: Secondary | ICD-10-CM

## 2019-02-20 NOTE — Progress Notes (Signed)
Based on what you shared with me, I feel your condition warrants further evaluation and I recommend that you be seen for a face to face office visit.  NOTE: If you entered your credit card information for this eVisit, you will not be charged. You may see a "hold" on your card for the $35 but that hold will drop off and you will not have a charge processed.  If you are having a true medical emergency please call 911.     For an urgent face to face visit, [facility] has four urgent care centers for your convenience:    NEW:  [facility]   [email or web address]%2c+[address]+%2c+[place]+[address]                                                   [place], [place] [address] .  Monday - Friday 10 am - 6 pm    . [facility]    [phone]                  Get Driving Directions  [id number] [place], [place] [address] . 10 am to 8 pm Monday-Friday . 12 pm to 8 pm Saturday-Sunday   . [facility] [place]                  Get Driving Directions  [id number] [place], [place], [place] [address] . 8 am to 8 pm Monday-Friday . 9 am to 6 pm Saturday . 11 am to 6 pm Sunday     . [facility] at [place]                  Get Driving Directions   [address] [place], [place] [address] . 8 am to 8 pm Monday-Friday . 8 am to 4 pm Saturday-Sunday    . [facility] [place]                    Get Driving Directions  [id number]  [address], [place],  [address]  . Monday-Friday, 12 PM to 6 PM    Your e-visit answers were reviewed by a board certified advanced clinical practitioner to complete your personal care plan.  Thank you for using e-Visits.   Greater than 5 minutes, yet  less than 10 minutes of time have been spent researching, coordinating, and implementing care for this patient today

## 2019-09-30 ENCOUNTER — Other Ambulatory Visit: Payer: Self-pay

## 2019-09-30 NOTE — Progress Notes (Signed)
49 y.o. G78P2002 Married Caucasian female here for annual exam.     States her periods are "crazy." Menses are monthly.  Can be heavy or light.  Can have nausea and headache right before her menses start.  Notes some temp dis regulation - cold and warm.   Patient would like labs today. She does take some iron during her period.   After pregnancy, she took a birth control pills and she had a lots of breakthrough bleeding.  She did not have any complications.  No hx migraine headache. No HTN.  No hx blood clots.   Received Covid vaccine.   [facility].   PCP: None  Patient's last menstrual period was 09/07/2019 (approximate).           Sexually active: Yes.    The current method of family planning is vasectomy.    Exercising: Yes.    gym 4x/week Smoker:  no  Health Maintenance: Pap: 09-18-18 Neg:Neg HR HPV, 07-01-15 Neg:Neg HR HPV; 06-30-14 Neg History of abnormal Pap:  Yes, 2002 AGUS Pap; 05/26/10 LGSIL colpo bx done atypia with HPV effect MMG: 01-06-19 3D/Neg/density C/BiRads1 Colonoscopy:  NEVER BMD:   n/a  Result  n/a TDaP: out of date--will up date through work Gardasil:   no HIV: Neg in Preg Hep C: Never Screening Labs:  Today.    reports that she has never smoked. She has never used smokeless tobacco. She reports current alcohol use of about 3.0 - 4.0 standard drinks of alcohol per week. She reports that she does not use drugs.  Past Medical History:  Diagnosis Date  . Abnormal Pap smear of cervix   . Anemia 2020  . GERD (gastroesophageal reflux disease)    occ.  Marland Kitchen HSV-1 (herpes simplex virus 1) infection   . Melanoma ([place]) 11/2012   left thigh  . PONV (postoperative nausea and vomiting)     Past Surgical History:  Procedure Laterality Date  . COLPOSCOPY  05/2010   Mild Dysplasia  . DILITATION & CURRETTAGE/HYSTROSCOPY WITH NOVASURE ABLATION N/A 11/28/2016   Procedure: DILATATION & CURETTAGE/HYSTEROSCOPY WITH ROLLER BALL ABLATION W/BIPOLAR CAUTERY  AND ATTEMPTED NOVASURE ABLATION;  Surgeon: Nunzio Cobbs, MD;  Location: [facility];  Service: Gynecology;  Laterality: N/A;  . ENDOSCOPIC VEIN LASER TREATMENT Right 07/25/2016  . melanoma removal  11/2012   -left thigh  . WRIST FRACTURE SURGERY Left     Current Outpatient Medications  Medication Sig Dispense Refill  . B Complex Vitamins (B COMPLEX 1 PO)     . BLACK COHOSH EXTRACT PO Take by mouth.    . cetirizine (ZYRTEC) 10 MG tablet Take 10 mg by mouth daily as needed for allergies.     . Multiple Vitamin (MULTIVITAMIN) tablet Take 1 tablet by mouth daily.    . Probiotic Product (PROBIOTIC PO) Take 1 capsule by mouth daily.     . RESTASIS MULTIDOSE 0.05 % ophthalmic emulsion Place 1 drop into both eyes 2 (two) times daily.     . SOY ISOFLAVONE PO Take by mouth.    . valACYclovir (VALTREX) 1000 MG tablet Take 2 tablets now, repeat in 12 hours as needed for an outbreak. 20 tablet 1  . ibuprofen (ADVIL,MOTRIN) 800 MG tablet Take 1 tablet (800 mg total) by mouth every 8 (eight) hours as needed. 30 tablet 0   No current facility-administered medications for this visit.    Family History  Problem Relation Age of Onset  . Hypertension Mother   .  Osteoporosis Mother   . Breast cancer Mother        postmenopausal, age 2  . Hyperlipidemia Father   . Prostate cancer Father   . Cancer Father        bladder ca  . Heart disease Paternal Grandmother     Review of Systems  All other systems reviewed and are negative.   Exam:   BP 100/62   Pulse 80   Temp (!) 96 F (35.6 C) (Temporal)   Resp 18   Ht 5\' 9"  (1.753 m)   Wt 154 lb (69.9 kg)   LMP 09/07/2019 (Approximate)   BMI 22.74 kg/m     General appearance: alert, cooperative and appears stated age Head: normocephalic, without obvious abnormality, atraumatic Neck: no adenopathy, supple, symmetrical, trachea midline and thyroid normal to inspection and palpation Lungs: clear to auscultation bilaterally Breasts: normal  appearance, no masses or tenderness, No nipple retraction or dimpling, No nipple discharge or bleeding, No axillary adenopathy Heart: regular rate and rhythm Abdomen: soft, non-tender; no masses, no organomegaly Extremities: extremities normal, atraumatic, no cyanosis or edema Skin: skin color, texture, turgor normal. No rashes or lesions Lymph nodes: cervical, supraclavicular, and axillary nodes normal. Neurologic: grossly normal  Pelvic: External genitalia:  no lesions              No abnormal inguinal nodes palpated.              Urethra:  normal appearing urethra with no masses, tenderness or lesions              Bartholins and Skenes: normal                 Vagina: normal appearing vagina with normal color and discharge, no lesions              Cervix: no lesions              Pap taken: No. Bimanual Exam:  Uterus:  normal size, contour, position, consistency, mobility, non-tender              Adnexa: no mass, fullness, tenderness              Rectal exam: Yes.  .  Confirms.              Anus:  normal sphincter tone, no lesions  Chaperone was present for exam.  Assessment:   Well woman visit with normal exam. Perimenopausal female. Hx prior LGSIL.  Status post endometrial ablation. Attempted Novasure and then conversion to roller ball.  HSV 1. FH breast cancer in mother.   Plan: Mammogram screening discussed. Self breast awareness reviewed. Pap and HR HPV not needed.  We discussed the new ASCCP guidelines.  Guidelines for Calcium, Vitamin D, regular exercise program including cardiovascular and weight bearing exercise. Start Loestrin 24.  Refill of Valtrex.  Routine labs.  She will consider her options for colon cancer screening.  Follow up annually and prn.   After visit summary provided.

## 2019-10-01 ENCOUNTER — Ambulatory Visit (INDEPENDENT_AMBULATORY_CARE_PROVIDER_SITE_OTHER): Payer: 59 | Admitting: Obstetrics and Gynecology

## 2019-10-01 ENCOUNTER — Encounter: Payer: Self-pay | Admitting: Obstetrics and Gynecology

## 2019-10-01 VITALS — BP 100/62 | HR 80 | Temp 96.0°F | Resp 18 | Ht 69.0 in | Wt 154.0 lb

## 2019-10-01 DIAGNOSIS — Z01419 Encounter for gynecological examination (general) (routine) without abnormal findings: Secondary | ICD-10-CM

## 2019-10-01 MED ORDER — VALACYCLOVIR HCL 1 G PO TABS: ORAL_TABLET | ORAL | 1 refills | Status: DC

## 2019-10-01 MED ORDER — NORETHIN ACE-ETH ESTRAD-FE 1-20 MG-MCG(24) PO TABS: 1.0000 | ORAL_TABLET | Freq: Every day | ORAL | 3 refills | Status: DC

## 2019-10-01 MED FILL — BLISOVI 24 FE 1-20 MG-MCG(2: 1-20 | 84 days supply | Qty: 84 | Fill #0

## 2019-10-01 NOTE — Patient Instructions (Signed)

## 2019-10-02 DIAGNOSIS — H524 Presbyopia: Secondary | ICD-10-CM | POA: Diagnosis not present

## 2019-10-02 LAB — LIPID PANEL
Chol/HDL Ratio: 2.2 ratio (ref 0.0–4.4)
Cholesterol, Total: 208 mg/dL — ABNORMAL HIGH (ref 100–199)
HDL: 93 mg/dL (ref >39)
LDL Chol Calc (NIH): 105 mg/dL — ABNORMAL HIGH (ref 0–99)
Triglycerides: 52 mg/dL (ref 0–149)
VLDL Cholesterol Cal: 10 mg/dL (ref 5–40)

## 2019-10-02 LAB — COMPREHENSIVE METABOLIC PANEL
ALT: 10 IU/L (ref 0–32)
AST: 16 IU/L (ref 0–40)
Albumin/Globulin Ratio: 1.7 (ref 1.2–2.2)
Albumin: 4.3 g/dL (ref 3.8–4.8)
Alkaline Phosphatase: 78 IU/L (ref 48–121)
BUN/Creatinine Ratio: 17 (ref 9–23)
BUN: 13 mg/dL (ref 6–24)
Bilirubin Total: 0.4 mg/dL (ref 0.0–1.2)
CO2: 23 mmol/L (ref 20–29)
Calcium: 9.1 mg/dL (ref 8.7–10.2)
Chloride: 101 mmol/L (ref 96–106)
Creatinine, Ser: 0.76 mg/dL (ref 0.57–1.00)
GFR calc Af Amer: 106 mL/min/{1.73_m2} (ref >59)
GFR calc non Af Amer: 92 mL/min/{1.73_m2} (ref >59)
Globulin, Total: 2.6 g/dL (ref 1.5–4.5)
Glucose: 80 mg/dL (ref 65–99)
Potassium: 4.4 mmol/L (ref 3.5–5.2)
Sodium: 139 mmol/L (ref 134–144)
Total Protein: 6.9 g/dL (ref 6.0–8.5)

## 2019-10-02 LAB — CBC
Hematocrit: 43.9 % (ref 34.0–46.6)
Hemoglobin: 14.3 g/dL (ref 11.1–15.9)
MCH: 27.6 pg (ref 26.6–33.0)
MCHC: 32.6 g/dL (ref 31.5–35.7)
MCV: 85 fL (ref 79–97)
Platelets: 200 10*3/uL (ref 150–450)
RBC: 5.18 x10E6/uL (ref 3.77–5.28)
RDW: 13.8 % (ref 11.7–15.4)
WBC: 5.3 10*3/uL (ref 3.4–10.8)

## 2019-10-02 LAB — TSH: TSH: 0.735 u[IU]/mL (ref 0.450–4.500)

## 2019-10-24 ENCOUNTER — Telehealth: Payer: Self-pay | Admitting: Obstetrics and Gynecology

## 2019-10-24 NOTE — Telephone Encounter (Signed)
Spoke with patient. Patient reports she started Loestrin Fe on Sunday following the start of her menses. She is currently in week 3 of her pack and her menses has started. Reports flow is like a "normal" menses, no heavy bleeding or clots. Denies any s/s of anemia. No late or missed pills. She is asking if she should skip week 4 of her pack and start a new pack or continue into the 4wk?   Advised patient to continue with completing her current pack. Advised patient can take 3 months for menses to regulate. Continue to monitor menses, if new symptoms develop, irregular bleeding continues or bleeding becomes heavy, return call to office. Advised patient I will forward to Dr. Quincy Simmonds to review, our office will return call if any additional recommendations. Patient agreeable.   Routing to provider for final review. Patient is agreeable to disposition. Will close encounter.

## 2019-10-24 NOTE — Telephone Encounter (Signed)
[facility] Hey Dr. Quincy Simmonds-  Sorry if this message looks weird--I had to find an old message; MyChart would let me send you a new one (I'll look into it)   Quick question--when I saw you in late June (on a Weds) I was just starting my period and I went ahead and started taking the pill that Sunday. 2 weeks were fine; at the beginning of pill packet week 3 I started my period again and it has lasted all week. Question is this--should I skip pill packet week 4 and just start a new packet??  Thank you-  Ashley Quinn

## 2019-12-08 DIAGNOSIS — M1812 Unilateral primary osteoarthritis of first carpometacarpal joint, left hand: Secondary | ICD-10-CM | POA: Diagnosis not present

## 2019-12-08 DIAGNOSIS — M79642 Pain in left hand: Secondary | ICD-10-CM | POA: Diagnosis not present

## 2019-12-08 MED FILL — RESTASIS 0.05% EYE EMULSION: 0.05 | 90 days supply | Qty: 180 | Fill #0

## 2019-12-16 ENCOUNTER — Other Ambulatory Visit: Payer: Self-pay | Admitting: Obstetrics and Gynecology

## 2019-12-16 DIAGNOSIS — Z1231 Encounter for screening mammogram for malignant neoplasm of breast: Secondary | ICD-10-CM

## 2019-12-16 MED FILL — BLISOVI 24 FE 1-20 MG-MCG(2: 1-20 | 84 days supply | Qty: 84 | Fill #1

## 2020-01-08 ENCOUNTER — Ambulatory Visit
Admission: RE | Admit: 2020-01-08 | Discharge: 2020-01-08 | Disposition: A | Discharge status: Home / Self Care | Payer: 59 | Source: Ambulatory Visit | Attending: Obstetrics and Gynecology | Admitting: Obstetrics and Gynecology

## 2020-01-08 ENCOUNTER — Other Ambulatory Visit: Payer: Self-pay

## 2020-01-08 DIAGNOSIS — Z1231 Encounter for screening mammogram for malignant neoplasm of breast: Secondary | ICD-10-CM

## 2020-01-13 ENCOUNTER — Other Ambulatory Visit (HOSPITAL_COMMUNITY): Payer: Self-pay | Admitting: Orthopedic Surgery

## 2020-01-13 DIAGNOSIS — M13132 Monoarthritis, not elsewhere classified, left wrist: Secondary | ICD-10-CM | POA: Diagnosis not present

## 2020-01-13 DIAGNOSIS — G8918 Other acute postprocedural pain: Secondary | ICD-10-CM | POA: Diagnosis not present

## 2020-01-13 DIAGNOSIS — M1812 Unilateral primary osteoarthritis of first carpometacarpal joint, left hand: Secondary | ICD-10-CM | POA: Diagnosis not present

## 2020-01-13 HISTORY — PX: TENDON TRANSFER: SHX6109

## 2020-01-13 MED FILL — oxyCODONE HCL 5 MG TABS: 5 | 7 days supply | Qty: 42 | Fill #0

## 2020-01-13 MED FILL — ONDANSETRON ODT 8 MG TABLET: 8 | 5 days supply | Qty: 15 | Fill #0

## 2020-01-13 MED FILL — METHOCARBAMOL 500 MG TABS: 500 | 10 days supply | Qty: 40 | Fill #0

## 2020-01-13 MED FILL — CEPHALEXIN 500 MG CAPSULE: 500 | 7 days supply | Qty: 28 | Fill #0

## 2020-01-28 DIAGNOSIS — M79642 Pain in left hand: Secondary | ICD-10-CM | POA: Diagnosis not present

## 2020-01-28 DIAGNOSIS — M1812 Unilateral primary osteoarthritis of first carpometacarpal joint, left hand: Secondary | ICD-10-CM | POA: Diagnosis not present

## 2020-01-28 DIAGNOSIS — Z4789 Encounter for other orthopedic aftercare: Secondary | ICD-10-CM | POA: Diagnosis not present

## 2020-02-11 DIAGNOSIS — M1812 Unilateral primary osteoarthritis of first carpometacarpal joint, left hand: Secondary | ICD-10-CM | POA: Diagnosis not present

## 2020-02-11 DIAGNOSIS — M79642 Pain in left hand: Secondary | ICD-10-CM | POA: Diagnosis not present

## 2020-02-11 DIAGNOSIS — Z4789 Encounter for other orthopedic aftercare: Secondary | ICD-10-CM | POA: Diagnosis not present

## 2020-02-13 DIAGNOSIS — D485 Neoplasm of uncertain behavior of skin: Secondary | ICD-10-CM | POA: Diagnosis not present

## 2020-02-13 DIAGNOSIS — L738 Other specified follicular disorders: Secondary | ICD-10-CM | POA: Diagnosis not present

## 2020-02-13 DIAGNOSIS — L821 Other seborrheic keratosis: Secondary | ICD-10-CM | POA: Diagnosis not present

## 2020-02-13 DIAGNOSIS — L813 Cafe au lait spots: Secondary | ICD-10-CM | POA: Diagnosis not present

## 2020-02-13 DIAGNOSIS — L820 Inflamed seborrheic keratosis: Secondary | ICD-10-CM | POA: Diagnosis not present

## 2020-02-13 DIAGNOSIS — D225 Melanocytic nevi of trunk: Secondary | ICD-10-CM | POA: Diagnosis not present

## 2020-02-13 DIAGNOSIS — Z85820 Personal history of malignant melanoma of skin: Secondary | ICD-10-CM | POA: Diagnosis not present

## 2020-02-13 DIAGNOSIS — D1801 Hemangioma of skin and subcutaneous tissue: Secondary | ICD-10-CM | POA: Diagnosis not present

## 2020-02-13 DIAGNOSIS — L814 Other melanin hyperpigmentation: Secondary | ICD-10-CM | POA: Diagnosis not present

## 2020-02-26 DIAGNOSIS — M1812 Unilateral primary osteoarthritis of first carpometacarpal joint, left hand: Secondary | ICD-10-CM | POA: Diagnosis not present

## 2020-02-26 DIAGNOSIS — M79645 Pain in left finger(s): Secondary | ICD-10-CM | POA: Diagnosis not present

## 2020-03-03 DIAGNOSIS — M79642 Pain in left hand: Secondary | ICD-10-CM | POA: Diagnosis not present

## 2020-03-08 DIAGNOSIS — M79642 Pain in left hand: Secondary | ICD-10-CM | POA: Diagnosis not present

## 2020-03-17 DIAGNOSIS — M79645 Pain in left finger(s): Secondary | ICD-10-CM | POA: Diagnosis not present

## 2020-03-25 DIAGNOSIS — M1812 Unilateral primary osteoarthritis of first carpometacarpal joint, left hand: Secondary | ICD-10-CM | POA: Diagnosis not present

## 2020-03-25 DIAGNOSIS — M79645 Pain in left finger(s): Secondary | ICD-10-CM | POA: Diagnosis not present

## 2020-03-25 DIAGNOSIS — Z4789 Encounter for other orthopedic aftercare: Secondary | ICD-10-CM | POA: Diagnosis not present

## 2020-04-01 DIAGNOSIS — M79645 Pain in left finger(s): Secondary | ICD-10-CM | POA: Diagnosis not present

## 2020-04-01 DIAGNOSIS — M79642 Pain in left hand: Secondary | ICD-10-CM | POA: Diagnosis not present

## 2020-04-18 DIAGNOSIS — Z1152 Encounter for screening for COVID-19: Secondary | ICD-10-CM | POA: Diagnosis not present

## 2020-06-24 ENCOUNTER — Ambulatory Visit (INDEPENDENT_AMBULATORY_CARE_PROVIDER_SITE_OTHER): Payer: 59

## 2020-06-24 ENCOUNTER — Other Ambulatory Visit: Payer: Self-pay

## 2020-06-24 ENCOUNTER — Ambulatory Visit (INDEPENDENT_AMBULATORY_CARE_PROVIDER_SITE_OTHER): Payer: 59 | Admitting: Sports Medicine

## 2020-06-24 ENCOUNTER — Encounter: Payer: Self-pay | Admitting: Sports Medicine

## 2020-06-24 DIAGNOSIS — M67479 Ganglion, unspecified ankle and foot: Secondary | ICD-10-CM | POA: Diagnosis not present

## 2020-06-24 DIAGNOSIS — M2042 Other hammer toe(s) (acquired), left foot: Secondary | ICD-10-CM | POA: Diagnosis not present

## 2020-06-24 DIAGNOSIS — M79672 Pain in left foot: Secondary | ICD-10-CM

## 2020-06-24 NOTE — Progress Notes (Signed)
Subjective: Mardelle S Riffe Stephanie Acre is a 50 y.o. female patient who presents to office for evaluation of left toe lesion.  Patient reports that there is no pain to the area but did notice the bump about 6 months ago reports that it is staying about the same she is tries over-the-counter corn remover to it but it did not really make a difference there is no pain does not remember any injury but is a very avid runner.  Patient also reports that she has a family history of bad feet including bunions and hammer toes and just wanted to have her foot checked to make sure she was not creating a another problem or issue for herself.  Review of systems noncontributory  Patient Active Problem List   Diagnosis Date Noted  . Dermatitis 06/01/2016  . Wrist pain, right 02/03/2014  . Menorrhagia 06/26/2013    Current Outpatient Medications on File Prior to Visit  Medication Sig Dispense Refill  . B Complex Vitamins (B COMPLEX 1 PO)     . BLACK COHOSH EXTRACT PO Take by mouth.    . cephALEXin (KEFLEX) 500 MG capsule Take 500 mg by mouth 4 (four) times daily.    . cetirizine (ZYRTEC) 10 MG tablet Take 10 mg by mouth daily as needed for allergies.     . methocarbamol (ROBAXIN) 500 MG tablet Take 500 mg by mouth.    . Multiple Vitamin (MULTIVITAMIN) tablet Take 1 tablet by mouth daily.    . Norethindrone Acetate-Ethinyl Estrad-FE (LOESTRIN 24 FE) 1-20 MG-MCG(24) tablet Take 1 tablet by mouth daily. 92 tablet 3  . Omega-3 Fatty Acids (FISH OIL) 1000 MG CAPS     . ondansetron (ZOFRAN-ODT) 8 MG disintegrating tablet Take 8 mg by mouth every 8 (eight) hours as needed.    Marland Kitchen oxyCODONE (OXY IR/ROXICODONE) 5 MG immediate release tablet Take 5 mg by mouth every 4 (four) hours as needed.    . Probiotic Product (PROBIOTIC PO) Take 1 capsule by mouth daily.     . RESTASIS MULTIDOSE 0.05 % ophthalmic emulsion Place 1 drop into both eyes 2 (two) times daily.     . SOY ISOFLAVONE PO Take by mouth.    . valACYclovir (VALTREX)  1000 MG tablet Take 2 tablets now, repeat in 12 hours as needed for an outbreak. 20 tablet 1   No current facility-administered medications on file prior to visit.    No Known Allergies  Objective:  General: Alert and oriented x3 in no acute distress  Dermatology: Small raised soft tissue mass over the interphalangeal joint of the left third toe consistent with cyst measures less than 0.5 cm with no significant redness warmth or active drainage or signs of infection.  No webspace macerations, no ecchymosis bilateral, all nails x 10 are well manicured.  Vascular: Dorsalis Pedis and Posterior Tibial pedal pulses 2/4, Capillary Fill Time 3 seconds,(+) pedal hair growth bilateral, no edema bilateral lower extremities, Temperature gradient within normal limits.  Neurology: Johney Maine sensation intact via light touch bilateral.  Musculoskeletal: There is bunion and tailor's bunion deformity noted bilateral, semi-flexible hammertoes 2-5 with no tenderness palpation to the interphalangeal joints.  Strength within normal limits in all groups bilateral.   Gait: Unassisted, Non-antalgic.  Xrays  Left   Impression: Significant bunion and hammertoe deformity with digital contracture.  No other acute osseous findings.       Assessment and Plan: Problem List Items Addressed This Visit   None   Visit Diagnoses  Pain in left foot    -  Primary   Relevant Orders   DG Foot Complete Left   Hammertoe of left foot       Digital mucinous cyst of toe           -Complete examination performed -Xrays reviewed -Discussed treatment options for nonpainful cyst of toe -Advised patient to try Coban compression to the toe to see if this will help her body resolve the cyst fluid if this works then patient will not need any other procedures at this time however if continues to enlarge or becomes painful may benefit from aspiration versus excision in the future -Advised patient to wear good supportive shoes that  do not rub toes -Patient to return to office as needed or sooner if condition worsens.  Landis Martins, DPM

## 2020-06-30 ENCOUNTER — Other Ambulatory Visit: Payer: Self-pay | Admitting: Sports Medicine

## 2020-06-30 DIAGNOSIS — M2042 Other hammer toe(s) (acquired), left foot: Secondary | ICD-10-CM

## 2020-07-28 DIAGNOSIS — I8312 Varicose veins of left lower extremity with inflammation: Secondary | ICD-10-CM | POA: Diagnosis not present

## 2020-08-08 DIAGNOSIS — U071 COVID-19: Secondary | ICD-10-CM

## 2020-08-08 HISTORY — DX: COVID-19: U07.1

## 2020-08-10 DIAGNOSIS — I8312 Varicose veins of left lower extremity with inflammation: Secondary | ICD-10-CM | POA: Diagnosis not present

## 2020-08-11 ENCOUNTER — Ambulatory Visit: Payer: 59 | Admitting: Physician Assistant

## 2020-08-13 DIAGNOSIS — I8312 Varicose veins of left lower extremity with inflammation: Secondary | ICD-10-CM | POA: Diagnosis not present

## 2020-09-29 DIAGNOSIS — H5213 Myopia, bilateral: Secondary | ICD-10-CM | POA: Diagnosis not present

## 2020-10-04 ENCOUNTER — Other Ambulatory Visit (HOSPITAL_COMMUNITY): Payer: Self-pay

## 2020-10-04 ENCOUNTER — Ambulatory Visit: Payer: 59 | Admitting: Obstetrics and Gynecology

## 2020-10-04 ENCOUNTER — Telehealth: Payer: Self-pay | Admitting: Obstetrics and Gynecology

## 2020-10-04 ENCOUNTER — Other Ambulatory Visit: Payer: Self-pay

## 2020-10-04 ENCOUNTER — Ambulatory Visit (INDEPENDENT_AMBULATORY_CARE_PROVIDER_SITE_OTHER): Payer: 59 | Admitting: Obstetrics and Gynecology

## 2020-10-04 ENCOUNTER — Encounter: Payer: Self-pay | Admitting: Obstetrics and Gynecology

## 2020-10-04 VITALS — BP 110/62 | HR 76 | Resp 16 | Ht 68.5 in | Wt 155.0 lb

## 2020-10-04 DIAGNOSIS — Z01419 Encounter for gynecological examination (general) (routine) without abnormal findings: Secondary | ICD-10-CM

## 2020-10-04 MED ORDER — VALACYCLOVIR HCL 1 G PO TABS
ORAL_TABLET | ORAL | 1 refills | Status: DC
  Filled 2020-10-04: qty 20, 9d supply, fill #0
  Filled 2020-11-22: qty 20, 10d supply, fill #0

## 2020-10-04 NOTE — Telephone Encounter (Signed)
Please send through an order for Cologuard for my patient for colon cancer screening.

## 2020-10-04 NOTE — Patient Instructions (Signed)

## 2020-10-04 NOTE — Progress Notes (Signed)
50 y.o. G61P2002 Married Caucasian female here for annual exam.    Would like to return one day this week for fasting labs.  Took birth control pills for 6 months and she had break through bleeding and swelling.  Has cycle every month.   Starts heavy and quickly becomes light.   Some difficulty sleeping right before her period.  Practices good sleep hygiene.  Melatonin helps.  This is currently manageable.  Had Covid in May, 2022.   PCP:   None  Patient's last menstrual period was 09/11/2020 (exact date).     Period Cycle (Days): 30 Period Duration (Days): 4-5 Period Pattern: Regular Menstrual Flow:  (heavy x36 hours then tapers) Menstrual Control: Tampon (super plus tampons first 36 hours) Dysmenorrhea: None     Sexually active: Yes.    The current method of family planning is vasectomy.    Exercising: Yes.     Cardio and weights Smoker:  no  Health Maintenance: Pap: 09-18-18 Neg:Neg HR HPV, 07-01-15 Neg:Neg HR HPV; 06-30-14 Neg  History of abnormal Pap:  yes, 2002 AGUS Pap; 05/26/10 LGSIL colpo bx done atypia with HPV effect MMG: 01-08-20 3D/Neg/BiRads1 Colonoscopy: NEVER BMD:  n/a  Result  n/a TDaP: thru work Gardasil:   no HIV:Neg in preg Hep C:Never Screening Labs:  fasting.    reports that she has never smoked. She has never used smokeless tobacco. She reports current alcohol use of about 3.0 - 4.0 standard drinks of alcohol per week. She reports that she does not use drugs.  Past Medical History:  Diagnosis Date   Abnormal Pap smear of cervix    Anemia 2020   COVID 08/2020   GERD (gastroesophageal reflux disease)    occ.   HSV-1 (herpes simplex virus 1) infection    Melanoma ([place]) 11/2012   left thigh   PONV (postoperative nausea and vomiting)     Past Surgical History:  Procedure Laterality Date   COLPOSCOPY  05/2010   Mild Dysplasia   DILITATION & CURRETTAGE/HYSTROSCOPY WITH NOVASURE ABLATION N/A 11/28/2016   Procedure: DILATATION &  CURETTAGE/HYSTEROSCOPY WITH ROLLER BALL ABLATION W/BIPOLAR CAUTERY AND ATTEMPTED NOVASURE ABLATION;  Surgeon: Nunzio Cobbs, MD;  Location: [facility];  Service: Gynecology;  Laterality: N/A;   ENDOSCOPIC VEIN LASER TREATMENT Right 07/25/2016   melanoma removal  11/2012   -left thigh   TENDON TRANSFER Left 01/13/2020   WRIST FRACTURE SURGERY Left     Current Outpatient Medications  Medication Sig Dispense Refill   B Complex Vitamins (B COMPLEX 1 PO)      BLACK COHOSH EXTRACT PO Take by mouth.     cetirizine (ZYRTEC) 10 MG tablet Take 10 mg by mouth daily as needed for allergies.      Melatonin 5 MG CHEW Chew 1 tablet by mouth daily as needed.     Multiple Vitamin (MULTIVITAMIN) tablet Take 1 tablet by mouth daily.     Nutritional Supplements (ESTROVEN PO) Take 1 tablet by mouth daily.     ondansetron (ZOFRAN-ODT) 8 MG disintegrating tablet TAKE 1 TABLET BY MOUTH EVERY 8 HOURS AS NEEDED FOR NAUSEA FOR 5 DAYS. 15 tablet 0   Probiotic Product (PROBIOTIC PO) Take 1 capsule by mouth daily.      RESTASIS MULTIDOSE 0.05 % ophthalmic emulsion Place 1 drop into both eyes 2 (two) times daily.      valACYclovir (VALTREX) 1000 MG tablet Take 2 tablets now, repeat in 12 hours as needed for an outbreak. 20 tablet 1  No current facility-administered medications for this visit.    Family History  Problem Relation Age of Onset   Hypertension Mother    Osteoporosis Mother    Breast cancer Mother        postmenopausal, age 56   Hyperlipidemia Father    Prostate cancer Father    Cancer Father        bladder ca   Heart disease Paternal Grandmother     Review of Systems  Psychiatric/Behavioral:  Positive for sleep disturbance (insomnia).   All other systems reviewed and are negative.  Exam:   BP 110/62   Pulse 76   Resp 16   Ht 5' 8.5" (1.74 m)   Wt 155 lb (70.3 kg)   LMP 09/11/2020 (Exact Date)   BMI 23.22 kg/m     General appearance: alert, cooperative and appears stated  age Head: normocephalic, without obvious abnormality, atraumatic Neck: no adenopathy, supple, symmetrical, trachea midline and thyroid normal to inspection and palpation Lungs: clear to auscultation bilaterally Breasts: normal appearance, no masses or tenderness, No nipple retraction or dimpling, No nipple discharge or bleeding, No axillary adenopathy Heart: regular rate and rhythm Abdomen: soft, non-tender; no masses, no organomegaly Extremities: extremities normal, atraumatic, no cyanosis or edema Skin: skin color, texture, turgor normal. No rashes or lesions Lymph nodes: cervical, supraclavicular, and axillary nodes normal. Neurologic: grossly normal  Pelvic: External genitalia:  no lesions              No abnormal inguinal nodes palpated.              Urethra:  normal appearing urethra with no masses, tenderness or lesions              Bartholins and Skenes: normal                 Vagina: normal appearing vagina with normal color and discharge, no lesions              Cervix: no lesions              Pap taken: no Bimanual Exam:  Uterus:  normal size, contour, position, consistency, mobility, non-tender              Adnexa: no mass, fullness, tenderness              Rectal exam: yes.  Confirms.              Anus:  normal sphincter tone, no lesions  Chaperone was present for exam.  Assessment:   Well woman visit with normal exam. Hx prior LGSIL in 2012.  Status post endometrial ablation.  Attempted Novasure and then conversion to roller ball. HSV 1.  FH breast cancer in mother.   Plan: Mammogram screening discussed. Self breast awareness reviewed. Pap and HR HPV as above. Guidelines for Calcium, Vitamin D, regular exercise program including cardiovascular and weight bearing exercise. Return for fasting labs.  We discussed colon cancer screening with colonoscopy versus Cologuard.  Patient chooses Cologuard.  Will have order placed.  Refill of Valtrex.  Follow up annually and  prn.

## 2020-10-07 ENCOUNTER — Other Ambulatory Visit: Payer: 59

## 2020-10-07 ENCOUNTER — Other Ambulatory Visit: Payer: Self-pay

## 2020-10-07 DIAGNOSIS — Z01419 Encounter for gynecological examination (general) (routine) without abnormal findings: Secondary | ICD-10-CM | POA: Diagnosis not present

## 2020-10-08 LAB — LIPID PANEL
Cholesterol: 188 mg/dL
HDL: 85 mg/dL
LDL Cholesterol (Calc): 92 mg/dL
Non-HDL Cholesterol (Calc): 103 mg/dL
Total CHOL/HDL Ratio: 2.2 (calc)
Triglycerides: 37 mg/dL

## 2020-10-08 LAB — CBC
HCT: 38.8 % (ref 35.0–45.0)
Hemoglobin: 12.2 g/dL (ref 11.7–15.5)
MCH: 25.6 pg — ABNORMAL LOW (ref 27.0–33.0)
MCHC: 31.4 g/dL — ABNORMAL LOW (ref 32.0–36.0)
MCV: 81.3 fL (ref 80.0–100.0)
MPV: 11.5 fL (ref 7.5–12.5)
Platelets: 189 Thousand/uL (ref 140–400)
RBC: 4.77 Million/uL (ref 3.80–5.10)
RDW: 13.7 % (ref 11.0–15.0)
WBC: 4.7 Thousand/uL (ref 3.8–10.8)

## 2020-10-08 LAB — COMPREHENSIVE METABOLIC PANEL WITH GFR
AG Ratio: 1.8 (calc) (ref 1.0–2.5)
ALT: 12 U/L (ref 6–29)
AST: 17 U/L (ref 10–35)
Albumin: 4.3 g/dL (ref 3.6–5.1)
Alkaline phosphatase (APISO): 64 U/L (ref 37–153)
BUN: 19 mg/dL (ref 7–25)
CO2: 24 mmol/L (ref 20–32)
Calcium: 9.2 mg/dL (ref 8.6–10.4)
Chloride: 101 mmol/L (ref 98–110)
Creat: 0.7 mg/dL (ref 0.50–1.05)
Globulin: 2.4 g/dL (ref 1.9–3.7)
Glucose, Bld: 83 mg/dL (ref 65–99)
Potassium: 5.1 mmol/L (ref 3.5–5.3)
Sodium: 134 mmol/L — ABNORMAL LOW (ref 135–146)
Total Bilirubin: 0.4 mg/dL (ref 0.2–1.2)
Total Protein: 6.7 g/dL (ref 6.1–8.1)

## 2020-10-08 LAB — TSH: TSH: 0.97 m[IU]/L

## 2020-10-08 LAB — VITAMIN D 25 HYDROXY (VIT D DEFICIENCY, FRACTURES): Vit D, 25-Hydroxy: 25 ng/mL — ABNORMAL LOW (ref 30–100)

## 2020-10-20 ENCOUNTER — Other Ambulatory Visit: Payer: Self-pay

## 2020-10-20 ENCOUNTER — Encounter: Payer: Self-pay | Admitting: Obstetrics and Gynecology

## 2020-10-20 DIAGNOSIS — Z1211 Encounter for screening for malignant neoplasm of colon: Secondary | ICD-10-CM

## 2020-11-03 DIAGNOSIS — I8312 Varicose veins of left lower extremity with inflammation: Secondary | ICD-10-CM | POA: Diagnosis not present

## 2020-11-11 DIAGNOSIS — Z1211 Encounter for screening for malignant neoplasm of colon: Secondary | ICD-10-CM | POA: Diagnosis not present

## 2020-11-12 ENCOUNTER — Ambulatory Visit: Payer: 59 | Attending: Internal Medicine

## 2020-11-12 ENCOUNTER — Other Ambulatory Visit (HOSPITAL_BASED_OUTPATIENT_CLINIC_OR_DEPARTMENT_OTHER): Payer: Self-pay

## 2020-11-12 ENCOUNTER — Other Ambulatory Visit: Payer: Self-pay

## 2020-11-12 DIAGNOSIS — Z23 Encounter for immunization: Secondary | ICD-10-CM

## 2020-11-12 MED ORDER — PFIZER-BIONT COVID-19 VAC-TRIS 30 MCG/0.3ML IM SUSP
INTRAMUSCULAR | 0 refills | Status: DC
  Filled 2020-11-12: qty 0.3, 1d supply, fill #0

## 2020-11-12 NOTE — Progress Notes (Signed)
   Covid-19 Vaccination Clinic  Name:  Ashley Quinn    MRN: [id number] DOB: May 20, 1970  11/12/2020  [name] was observed post Covid-19 immunization for 15 minutes without incident. She was provided with Vaccine Information Sheet and instruction to access the V-Safe system.   [name] was instructed to call 911 with any severe reactions post vaccine: Difficulty breathing  Swelling of face and throat  A fast heartbeat  A bad rash all over body  Dizziness and weakness   Immunizations Administered     Name Date Dose VIS Date Route   PFIZER Comrnaty(Gray TOP) Covid-19 Vaccine 11/12/2020  9:41 AM 0.3 mL 03/18/2020 Intramuscular   Manufacturer: [place]   Lot: [id number]   [place]: [phone]

## 2020-11-15 ENCOUNTER — Other Ambulatory Visit: Payer: Self-pay

## 2020-11-15 NOTE — Progress Notes (Signed)
Erroneous encounter

## 2020-11-15 NOTE — Telephone Encounter (Signed)
Order sent. See note from [place] on 10-20-20.

## 2020-11-17 DIAGNOSIS — I788 Other diseases of capillaries: Secondary | ICD-10-CM | POA: Diagnosis not present

## 2020-11-17 DIAGNOSIS — I8312 Varicose veins of left lower extremity with inflammation: Secondary | ICD-10-CM | POA: Diagnosis not present

## 2020-11-19 LAB — COLOGUARD: Cologuard: NEGATIVE

## 2020-11-22 ENCOUNTER — Other Ambulatory Visit (HOSPITAL_COMMUNITY): Payer: Self-pay

## 2020-11-29 ENCOUNTER — Ambulatory Visit (INDEPENDENT_AMBULATORY_CARE_PROVIDER_SITE_OTHER): Payer: 59 | Admitting: Podiatrist

## 2020-11-29 ENCOUNTER — Encounter: Payer: Self-pay | Admitting: Podiatrist

## 2020-11-29 ENCOUNTER — Other Ambulatory Visit: Payer: Self-pay

## 2020-11-29 DIAGNOSIS — M67472 Ganglion, left ankle and foot: Secondary | ICD-10-CM

## 2020-11-29 NOTE — Patient Instructions (Signed)
Continue to wrap the toe with coban for another week.  If the cyst still returns, you may need to have it surgically excised.  If that happens, feel free to call the office and they will set you up for this appointment to discuss surgery.

## 2020-11-30 NOTE — Progress Notes (Signed)
Chief Complaint  Patient presents with   Cyst    Pt states she has 3rd left toe cyst since march. Pt states she has been wrapping in coband as instructed but has worsened since her last visit.      HPI: Patient is 50 y.o. female who presents today for follow-up of cyst on her left third toe.  She states she is been wrapping with Coban wrap but the cysts have grown larger since her last visit.  She is relates its uncomfortable in shoes and generally tender.   No Known Allergies  Review of systems is reviewed and negative.   Physical Exam  Patient is awake, alert, and oriented x 3.  In no acute distress.    Vascular status is intact with palpable pedal pulses DP and PT bilateral and capillary refill time less than 3 seconds bilateral.  No edema or erythema noted.   Neurological exam reveals epicritic and protective sensation grossly intact bilateral.   Dermatological exam reveals skin is supple and dry to bilateral feet.  No open lesions present.    Musculoskeletal exam: Musculature intact with dorsiflexion, plantarflexion, inversion, eversion. Ankle and First MPJ joint range of motion normal.   Cyst is present on the dorsal distal interphalangeal joint of the left third toe at the level of the joint itself.  It is tender with palpation and appears to be a digital mucoid cyst.    Assessment: 1. Digital mucous cyst of toe of left foot      Plan: Discussed exam findings and treatment options and alternatives.  Recommended draining the cyst and the patient agreed I anesthetized the toe with 0.25% Marcaine plain in a digital block fashion I then cleansed the area with Betadine solution and utilizing an 18-gauge needle was able to fully aspirate the lesion.  A clear gelatinous fluid was expressed.  I then partially deroofed the lesion with a tissue nipper and applied antibiotic ointment and dry sterile and compressive dressing. Recommended keeping the area covered with antibiotic ointment  and a dressing until the area heals.  I did discuss with her that this lesion may need a deeper excision of the stalk withing the joint in order to remove the lesion- if this procedure does not resolve the issue.  If the lesion comes back she will call our office ,otherwise she will be seen back as needed.

## 2020-12-01 ENCOUNTER — Other Ambulatory Visit: Payer: Self-pay

## 2020-12-01 ENCOUNTER — Other Ambulatory Visit (HOSPITAL_COMMUNITY): Payer: Self-pay

## 2020-12-01 ENCOUNTER — Ambulatory Visit (INDEPENDENT_AMBULATORY_CARE_PROVIDER_SITE_OTHER): Payer: 59 | Admitting: Sports Medicine

## 2020-12-01 VITALS — Ht 69.5 in | Wt 153.0 lb

## 2020-12-01 DIAGNOSIS — G5702 Lesion of sciatic nerve, left lower limb: Secondary | ICD-10-CM | POA: Diagnosis not present

## 2020-12-01 MED ORDER — TIZANIDINE HCL 2 MG PO TABS
2.0000 mg | ORAL_TABLET | Freq: Every day | ORAL | 0 refills | Status: DC
  Filled 2020-12-01: qty 30, 30d supply, fill #0

## 2020-12-01 MED ORDER — PREDNISONE 10 MG (21) PO TBPK
ORAL_TABLET | ORAL | 0 refills | Status: DC
  Filled 2020-12-01: qty 21, 6d supply, fill #0

## 2020-12-01 NOTE — Patient Instructions (Addendum)
It was great to see you today, thank you for letting me participate in your care!  Today, we discussed your low back/buttock pain.  This is most likely due to piriformis syndrome.  -We will do a 6-day prednisone taper -Perform exercises/stretches twice a day for the next few weeks -Tizanidine '2mg'$  at bedtime as needed if spasms/keeping you awake at night. Try the '2mg'$  tablet first, if after 40 mins this is not helping, you may take a second tablet. This can make you groggy, so make sure you are not driving within 6 hours of taking this.  You will follow-up as needed. If this is still giving you issues in a month, come back to see me.  If you have any further questions, please give the clinic a call [phone].  Cheers,  [place]

## 2020-12-01 NOTE — Progress Notes (Signed)
PCP: Patient, No Pcp Per (Inactive)  Subjective:   HPI: Patient is a 50 y.o. female here for left low back and buttock pain x 2-3 months.  Patient states she has had left low back and buttock pain for few months.  She believes this started sometime after a 12-hour drive there and back to [place] with her family back in May.  She denies any injury.  No prior history of back pain.  Dates the pain is sharp in the low back and buttock area.  It is worse at nighttime when she is trying to sleep.  When she wakes in the morning she feels stiff and a sharp pain in that area, it will linger throughout the day but does get better with movement.  She has tried rolling out her IT band, stretching, and doing yoga but with only minimal relief.  She denies any radiation of pain down the posterior thigh.  She has no midline back tenderness.  Denies any fever chills or signs of systemic illness.  She does take Advil as needed at nighttime for the pain, but with only mild relief.  Past Medical History:  Diagnosis Date   Abnormal Pap smear of cervix    Anemia 2020   COVID 08/2020   GERD (gastroesophageal reflux disease)    occ.   HSV-1 (herpes simplex virus 1) infection    Melanoma ([place]) 11/2012   left thigh   PONV (postoperative nausea and vomiting)     Current Outpatient Medications on File Prior to Visit  Medication Sig Dispense Refill   B Complex Vitamins (B COMPLEX 1 PO)      BLACK COHOSH EXTRACT PO Take by mouth.     cetirizine (ZYRTEC) 10 MG tablet Take 10 mg by mouth daily as needed for allergies.      COVID-19 mRNA Vac-TriS, Pfizer, (PFIZER-BIONT COVID-19 VAC-TRIS) SUSP injection Inject into the muscle. 0.3 mL 0   Melatonin 5 MG CHEW Chew 1 tablet by mouth daily as needed.     Multiple Vitamin (MULTIVITAMIN) tablet Take 1 tablet by mouth daily.     Nutritional Supplements (ESTROVEN PO) Take 1 tablet by mouth daily.     ondansetron (ZOFRAN-ODT) 8 MG disintegrating tablet TAKE 1 TABLET BY  MOUTH EVERY 8 HOURS AS NEEDED FOR NAUSEA FOR 5 DAYS. 15 tablet 0   Probiotic Product (PROBIOTIC PO) Take 1 capsule by mouth daily.      RESTASIS MULTIDOSE 0.05 % ophthalmic emulsion Place 1 drop into both eyes 2 (two) times daily.      valACYclovir (VALTREX) 1000 MG tablet Take 2 tablets now, repeat in 12 hours as needed for an outbreak. 20 tablet 1   No current facility-administered medications on file prior to visit.    Past Surgical History:  Procedure Laterality Date   COLPOSCOPY  05/2010   Mild Dysplasia   DILITATION & CURRETTAGE/HYSTROSCOPY WITH NOVASURE ABLATION N/A 11/28/2016   Procedure: DILATATION & CURETTAGE/HYSTEROSCOPY WITH ROLLER BALL ABLATION W/BIPOLAR CAUTERY AND ATTEMPTED NOVASURE ABLATION;  Surgeon: Nunzio Cobbs, MD;  Location: [facility];  Service: Gynecology;  Laterality: N/A;   ENDOSCOPIC VEIN LASER TREATMENT Right 07/25/2016   melanoma removal  11/2012   -left thigh   TENDON TRANSFER Left 01/13/2020   WRIST FRACTURE SURGERY Left     No Known Allergies  Social History   Socioeconomic History   Marital status: Married    Spouse name: Not on file   Number of children: Not on file  Years of education: Not on file   Highest education level: Not on file  Occupational History   Not on file  Tobacco Use   Smoking status: Never   Smokeless tobacco: Never  Vaping Use   Vaping Use: Never used  Substance and Sexual Activity   Alcohol use: Yes    Alcohol/week: 3.0 - 4.0 standard drinks    Types: 3 - 4 Glasses of wine per week   Drug use: No   Sexual activity: Yes    Partners: Male    Birth control/protection: Other-see comments    Comment: husband w/vasectomy  Other Topics Concern   Not on file  Social History Narrative   Not on file   Social Determinants of Health   Financial Resource Strain: Not on file  Food Insecurity: Not on file  Transportation Needs: Not on file  Physical Activity: Not on file  Stress: Not on file  Social  Connections: Not on file  Intimate Partner Violence: Not on file    Family History  Problem Relation Age of Onset   Hypertension Mother    Osteoporosis Mother    Breast cancer Mother        postmenopausal, age 18   Hyperlipidemia Father    Prostate cancer Father    Cancer Father        bladder ca   Heart disease Paternal Grandmother     Ht 5' 9.5" (1.765 m)   Wt 153 lb (69.4 kg)   BMI 22.27 kg/m   Elim Adult Exercise 12/01/2020  Frequency of aerobic exercise (# of days/week) 5  Average time in minutes 30  Frequency of strengthening activities (# of days/week) 4    No flowsheet data found.  Review of Systems: See HPI above.     Objective:  Physical Exam:  Gen: Well-appearing, in no acute distress; non-toxic CV: Regular Rate. Well-perfused. Warm.  Resp: Breathing unlabored on room air; no wheezing. Psych: Fluid speech in conversation; appropriate affect; normal thought process Neuro: Sensation intact throughout. No gross coordination deficits.  MSK:   - Low back/Hips: There is no notable scoliosis, no bony deformity; no erythema, ecchymosis or swelling.  There is no midline spinous tenderness.  There is mild TTP over the left SI joint.  There is significant TTP in the mid belly of the left piriformis with notable hypertonicity.  Full active range of motion in all directions of the lumbar spine and of bilateral hips.  Strength 5/5 of lower extremity.  Neurovascular intact.  Sensation to light touch intact throughout bilateral lower extremities.  Negative SLR.  + FAIR test.    Assessment & Plan:  1. Piriformis syndrome, left - present x 2-3 months, it has exacerbated to the point that it is keeping her awake at night and affecting daily living.  There is some mild SI joint tenderness of the left, but I think this is secondary to her hypertonic piriformis.  We will treat with a 6-day prednisone taper.  Stretches and exercises for the piriformis muscle were  given to the patient and demonstrated for her to perform twice daily.  I did give her a short course of tizanidine to be taken at nighttime for the pain as needed.  I would expect this to get better with the medication and exercises, she may follow-up in a month or more if the pain does not resolve, otherwise as needed.  Return precautions provided.  Elba Barman, DO PGY-4, Sports Medicine Fellow [facility]  Addendum:  Patient seen in the office by fellow.  His history, exam, plan of care were precepted with me.  Karlton Lemon MD Kirt Boys

## 2020-12-31 ENCOUNTER — Ambulatory Visit: Payer: 59 | Admitting: Physician Assistant

## 2021-01-06 ENCOUNTER — Other Ambulatory Visit: Payer: Self-pay | Admitting: Obstetrics and Gynecology

## 2021-01-06 DIAGNOSIS — Z1231 Encounter for screening mammogram for malignant neoplasm of breast: Secondary | ICD-10-CM

## 2021-01-12 ENCOUNTER — Other Ambulatory Visit (HOSPITAL_COMMUNITY): Payer: Self-pay

## 2021-01-12 MED ORDER — CYCLOSPORINE 0.05 % OP EMUL
OPHTHALMIC | 4 refills | Status: DC
  Filled 2021-01-12: qty 180, 90d supply, fill #0

## 2021-01-17 DIAGNOSIS — Z4789 Encounter for other orthopedic aftercare: Secondary | ICD-10-CM | POA: Diagnosis not present

## 2021-01-17 DIAGNOSIS — M79641 Pain in right hand: Secondary | ICD-10-CM | POA: Diagnosis not present

## 2021-01-17 DIAGNOSIS — M79644 Pain in right finger(s): Secondary | ICD-10-CM | POA: Diagnosis not present

## 2021-01-17 DIAGNOSIS — M13841 Other specified arthritis, right hand: Secondary | ICD-10-CM | POA: Diagnosis not present

## 2021-02-03 ENCOUNTER — Other Ambulatory Visit: Payer: Self-pay

## 2021-02-03 ENCOUNTER — Ambulatory Visit
Admission: RE | Admit: 2021-02-03 | Discharge: 2021-02-03 | Disposition: A | Discharge status: Home / Self Care | Payer: 59 | Source: Ambulatory Visit | Attending: Obstetrics and Gynecology | Admitting: Obstetrics and Gynecology

## 2021-02-03 DIAGNOSIS — Z1231 Encounter for screening mammogram for malignant neoplasm of breast: Secondary | ICD-10-CM | POA: Diagnosis not present

## 2021-02-07 ENCOUNTER — Other Ambulatory Visit: Payer: Self-pay | Admitting: Obstetrics and Gynecology

## 2021-02-07 DIAGNOSIS — R928 Other abnormal and inconclusive findings on diagnostic imaging of breast: Secondary | ICD-10-CM

## 2021-02-14 DIAGNOSIS — M13841 Other specified arthritis, right hand: Secondary | ICD-10-CM | POA: Diagnosis not present

## 2021-02-14 DIAGNOSIS — Z4789 Encounter for other orthopedic aftercare: Secondary | ICD-10-CM | POA: Diagnosis not present

## 2021-02-21 ENCOUNTER — Ambulatory Visit: Payer: 59

## 2021-02-21 ENCOUNTER — Ambulatory Visit
Admission: RE | Admit: 2021-02-21 | Discharge: 2021-02-21 | Disposition: A | Discharge status: Home / Self Care | Payer: 59 | Source: Ambulatory Visit | Attending: Obstetrics and Gynecology | Admitting: Obstetrics and Gynecology

## 2021-02-21 DIAGNOSIS — R928 Other abnormal and inconclusive findings on diagnostic imaging of breast: Secondary | ICD-10-CM

## 2021-02-21 DIAGNOSIS — R922 Inconclusive mammogram: Secondary | ICD-10-CM | POA: Diagnosis not present

## 2021-02-25 ENCOUNTER — Other Ambulatory Visit: Payer: 59

## 2021-02-28 DIAGNOSIS — D225 Melanocytic nevi of trunk: Secondary | ICD-10-CM | POA: Diagnosis not present

## 2021-02-28 DIAGNOSIS — L821 Other seborrheic keratosis: Secondary | ICD-10-CM | POA: Diagnosis not present

## 2021-02-28 DIAGNOSIS — D2262 Melanocytic nevi of left upper limb, including shoulder: Secondary | ICD-10-CM | POA: Diagnosis not present

## 2021-02-28 DIAGNOSIS — Z85820 Personal history of malignant melanoma of skin: Secondary | ICD-10-CM | POA: Diagnosis not present

## 2021-02-28 DIAGNOSIS — L814 Other melanin hyperpigmentation: Secondary | ICD-10-CM | POA: Diagnosis not present

## 2021-02-28 DIAGNOSIS — D1801 Hemangioma of skin and subcutaneous tissue: Secondary | ICD-10-CM | POA: Diagnosis not present

## 2021-03-07 ENCOUNTER — Ambulatory Visit (INDEPENDENT_AMBULATORY_CARE_PROVIDER_SITE_OTHER): Payer: 59 | Admitting: Podiatry

## 2021-03-07 ENCOUNTER — Other Ambulatory Visit: Payer: Self-pay

## 2021-03-07 ENCOUNTER — Encounter: Payer: Self-pay | Admitting: Podiatry

## 2021-03-07 DIAGNOSIS — M67472 Ganglion, left ankle and foot: Secondary | ICD-10-CM | POA: Diagnosis not present

## 2021-03-07 DIAGNOSIS — M2042 Other hammer toe(s) (acquired), left foot: Secondary | ICD-10-CM

## 2021-03-07 NOTE — Patient Instructions (Signed)
The surgery codes are: 24097 (excision benign lesion 1.0 cm), 35329 (hammertoe repair).   Pre-Operative Instructions  Congratulations, you have decided to take an important step to improving your quality of life.  You can be assured that the doctors of [facility] will be with you every step of the way.  Plan to be at the surgery center/hospital at least 1 (one) hour prior to your scheduled time unless otherwise directed by the surgical center/hospital staff.  You must have a responsible adult accompany you, remain during the surgery and drive you home.  Make sure you have directions to the surgical center/hospital and know how to get there on time. For hospital based surgery you will need to obtain a history and physical form from your family physician within 1 month prior to the date of surgery- we will give you a form for you primary physician.  We make every effort to accommodate the date you request for surgery.  There are however, times where surgery dates or times have to be moved.  We will contact you as soon as possible if a change in schedule is required.   No Aspirin/Ibuprofen for one week before surgery.  If you are on aspirin, any non-steroidal anti-inflammatory medications (Mobic, Aleve, Ibuprofen) you should stop taking it 7 days prior to your surgery.  You make take Tylenol  For pain prior to surgery.  Medications- If you are taking daily heart and blood pressure medications, seizure, reflux, allergy, asthma, anxiety, pain or diabetes medications, make sure the surgery center/hospital is aware before the day of surgery so they may notify you which medications to take or avoid the day of surgery. No food or drink after midnight the night before surgery unless directed otherwise by surgical center/hospital staff. No alcoholic beverages 24 hours prior to surgery.  No smoking 24 hours prior to or 24 hours after surgery. Wear loose pants or shorts- loose enough to fit over bandages,  boots, and casts. No slip on shoes, sneakers are best. Bring your boot with you to the surgery center/hospital.  Also bring crutches or a walker if your physician has prescribed it for you.  If you do not have this equipment, it will be provided for you after surgery. If you have not been contracted by the surgery center/hospital by the day before your surgery, call to confirm the date and time of your surgery. Leave-time from work may vary depending on the type of surgery you have.  Appropriate arrangements should be made prior to surgery with your employer. Prescriptions will be provided immediately following surgery by your doctor.  Have these filled as soon as possible after surgery and take the medication as directed. Remove nail polish on the operative foot. Wash the night before surgery.  The night before surgery wash the foot and leg well with the antibacterial soap provided and water paying special attention to beneath the toenails and in between the toes.  Rinse thoroughly with water and dry well with a towel.  Perform this wash unless told not to do so by your physician.  Enclosed: 1 Ice pack (please put in freezer the night before surgery)   1 Hibiclens skin cleaner   Pre-op Instructions  If you have any questions regarding the instructions, do not hesitate to call our office at any point during this process.   [place]: [place], [place] [address] [place]: [address], [place], [place] [address] [phone]  Dr. Celesta Gentile, DPM

## 2021-03-09 NOTE — Progress Notes (Signed)
Subjective: 50 year old female presents the office today for follow-up evaluation of the recurrent cyst in the dorsal aspect of the left third toe.  She said that she had it drained previously and this was on August 22 and afterwards came back 2 weeks later to be getting larger.  Most discussed other treatment options.  I does get tender at times.  Objective: AAO x3, NAD DP/PT pulses palpable bilaterally, CRT less than 3 seconds On the dorsal aspect of the left third toe on the DIPJ is a fluid-filled soft tissue mass consistent with likely mucoid versus ganglion cyst.  Slight tenderness palpation today.  Digital contractures present.  MMT 5/5. No pain with calf compression, swelling, warmth, erythema  Assessment: Soft tissue mass left third toe  Plan: -All treatment options discussed with the patient including all alternatives, risks, complications.  -Reviewed the prior x-rays.  Given the recurrence of the cyst has been getting larger we discussed surgical excision of this.  Discussed soft tissue mass excision with DIPJ arthroplasty.  After discussion she would like to proceed with this. -The incision placement as well as the postoperative course was discussed with the patient. I discussed risks of the surgery which include, but not limited to, infection, bleeding, pain, swelling, need for further surgery, delayed or nonhealing, painful or ugly scar, numbness or sensation changes, over/under correction, recurrence, transfer lesions, further deformity, DVT/PE, loss of toe/foot. Patient understands these risks and wishes to proceed with surgery. The surgical consent was reviewed with the patient all 3 pages were signed. No promises or guarantees were given to the outcome of the procedure. All questions were answered to the best of my ability. Before the surgery the patient was encouraged to call the office if there is any further questions. The surgery will be performed at the [facility] on an outpatient  basis. -Patient encouraged to call the office with any questions, concerns, change in symptoms.   Trula Slade DPM

## 2021-03-11 DIAGNOSIS — M792 Neuralgia and neuritis, unspecified: Secondary | ICD-10-CM | POA: Diagnosis not present

## 2021-03-11 DIAGNOSIS — M2042 Other hammer toe(s) (acquired), left foot: Secondary | ICD-10-CM | POA: Diagnosis not present

## 2021-03-11 DIAGNOSIS — M6740 Ganglion, unspecified site: Secondary | ICD-10-CM | POA: Diagnosis not present

## 2021-03-14 ENCOUNTER — Telehealth: Payer: Self-pay | Admitting: Urology

## 2021-03-14 NOTE — Telephone Encounter (Signed)
DOS - 03/21/21  EXC BENIGN LESION ---72761 HAMMERTOE REAPAIR 3RD LEFT --- 84859  [facility] EFFECTIVE DATE - 04/10/20   SPOKE WITH NATALIE WITH  [facility] AND SHE STATED THAT FOR CPT CODES 27639 AND 43200 NO PRIOR AUTH IS REQUIRED.   REF # [id number]

## 2021-03-21 ENCOUNTER — Ambulatory Visit: Payer: 59 | Admitting: Podiatry

## 2021-03-28 ENCOUNTER — Telehealth: Payer: Self-pay

## 2021-03-28 ENCOUNTER — Encounter: Payer: 59 | Admitting: Podiatry

## 2021-03-28 NOTE — Telephone Encounter (Signed)
Ashley Quinn called to cancel her surgery with Dr. Jacqualyn Posey on 03/30/2021. She stated she has had a cold and also wants to wait till next year to have her surgery. She scheduled an appointment with Dr. Jacqualyn Posey on 04/01/2021 to have her cyst drained to help relieve the pain till she can reschedule her surgery. Notified Dr. Jacqualyn Posey and Caren Griffins with [facility].

## 2021-04-01 ENCOUNTER — Ambulatory Visit (INDEPENDENT_AMBULATORY_CARE_PROVIDER_SITE_OTHER): Payer: 59 | Admitting: Podiatry

## 2021-04-01 DIAGNOSIS — M67472 Ganglion, left ankle and foot: Secondary | ICD-10-CM | POA: Diagnosis not present

## 2021-04-01 DIAGNOSIS — M79672 Pain in left foot: Secondary | ICD-10-CM

## 2021-04-05 ENCOUNTER — Encounter: Payer: 59 | Admitting: Podiatry

## 2021-04-06 NOTE — Progress Notes (Signed)
Subjective: 50 year old female presents the office today requesting cyst to be drained on her left third toe.  She was scheduled for surgery but she canceled this as she was sick.  She was sent this to discuss to be done in the office of hospice but for now she was to have the cyst drained.  No swelling or redness or any drainage.  No other changes otherwise.  Objective: AAO x3, NAD DP/PT pulses palpable bilaterally, CRT less than 3 seconds On the dorsal aspect of left third DIPJ is a fluid-filled soft tissue mass consider ganglion versus mucoid cyst.  I was able to drain this and clear, yellow fluid was expressed there is no purulence or signs of infection.  No other lesions are identified.  MMT 5/5. No pain with calf compression, swelling, warmth, erythema  Assessment: Soft tissue mass left third toe  Plan: -All treatment options discussed with the patient including all alternatives, risks, complications.  -Patient presents at the soft tissue mass drained.  Skin was prepped with alcohol and I utilized cold spray to anesthetize the toe.  I then utilized 25-gauge needle in order to drain the cyst and clear, joint fluid expressed there is no purulence.  I then utilized 1/4 cc dexamethasone phosphate into the area.  Compression bandage was applied.  She tolerated the injection well there any complications.  Post injection instructions discussed. -We will plan on likely removing the cyst in the office under local anesthesia only.  She will call to get this scheduled. -Patient encouraged to call the office with any questions, concerns, change in symptoms.    *At today's appointment there was a power outage.  The patient is someone to be seen during this.  Trula Slade DPM

## 2021-04-07 ENCOUNTER — Encounter: Payer: 59 | Admitting: Podiatry

## 2021-04-14 ENCOUNTER — Encounter: Payer: 59 | Admitting: Podiatry

## 2021-04-21 ENCOUNTER — Encounter: Payer: 59 | Admitting: Podiatry

## 2021-04-28 ENCOUNTER — Encounter: Payer: 59 | Admitting: Podiatry

## 2021-05-02 ENCOUNTER — Telehealth: Payer: Self-pay | Admitting: Family Medicine

## 2021-05-02 NOTE — Telephone Encounter (Signed)
I have LM make pt aware of this

## 2021-05-02 NOTE — Telephone Encounter (Signed)
Ok to establish 

## 2021-05-02 NOTE — Telephone Encounter (Signed)
Pt called in asking if DR. Birdie Riddle would take her own as a NP? She states that Devoria Glassing recommend [facility] to her.   Please advise

## 2021-05-19 ENCOUNTER — Other Ambulatory Visit (HOSPITAL_BASED_OUTPATIENT_CLINIC_OR_DEPARTMENT_OTHER): Payer: Self-pay

## 2021-05-23 ENCOUNTER — Ambulatory Visit: Payer: 59 | Admitting: Family Medicine

## 2021-05-23 ENCOUNTER — Encounter: Payer: Self-pay | Admitting: Family Medicine

## 2021-05-23 VITALS — BP 110/76 | HR 63 | Temp 97.7°F | Resp 16 | Wt 152.6 lb

## 2021-05-23 DIAGNOSIS — Z23 Encounter for immunization: Secondary | ICD-10-CM

## 2021-05-23 DIAGNOSIS — G4700 Insomnia, unspecified: Secondary | ICD-10-CM | POA: Diagnosis not present

## 2021-05-23 DIAGNOSIS — E559 Vitamin D deficiency, unspecified: Secondary | ICD-10-CM | POA: Diagnosis not present

## 2021-05-23 DIAGNOSIS — N921 Excessive and frequent menstruation with irregular cycle: Secondary | ICD-10-CM

## 2021-05-23 DIAGNOSIS — M2042 Other hammer toe(s) (acquired), left foot: Secondary | ICD-10-CM | POA: Insufficient documentation

## 2021-05-23 NOTE — Patient Instructions (Addendum)
Schedule your complete physical in 6 months No need for lab work today- yay!! Restart the Vit D supplements daily Consider restarting the pill- Loestrin w/ Iron or even lo-loestrin to help regulate periods START OTC sleep aides such as Magnesium Glycinate, Lavender, Valerian Root, CBD Call with any questions or concerns Stay Safe!  Stay Healthy! Welcome!!  We're glad you're here!!

## 2021-05-31 DIAGNOSIS — G4700 Insomnia, unspecified: Secondary | ICD-10-CM | POA: Insufficient documentation

## 2021-05-31 DIAGNOSIS — E559 Vitamin D deficiency, unspecified: Secondary | ICD-10-CM | POA: Insufficient documentation

## 2021-05-31 NOTE — Assessment & Plan Note (Signed)
New to provider, ongoing for pt.  Has trouble both falling asleep and staying asleep, but more so staying asleep.  She practices good sleep hygiene and has tried Melatonin in the past.  We discussed starting w/ OTC sleep aids and if these are not successful, we can proceed to prescription medication.  Pt is hesitant to take prescriptions at this time.  Will follow.

## 2021-05-31 NOTE — Progress Notes (Signed)
° °  Subjective:    Patient ID: Ashley Quinn, female    DOB: [birth year], 51 y.o.   MRN: [id number]  HPI New to establish.  Previous PCP- none  GYN- Dr Quincy Simmonds  UTD on pap, mammo, cologuard.  Due for Tdap.  Vit D deficiency- Pt's Vit D level was 25 in June.  Initially was taking Vit D supplements but admits that she has not been taking them recently.  Blood loss anemia- pt had episode of severe anemia that required visit to Dr Marin Olp.  Thankfully all was ok and she now takes iron when menstruating.  Now w/ perimenopause is having periods q2 weeks  Insomnia- will have trouble both falling asleep and staying asleep.  More often has trouble staying asleep.  Dad is not doing well, she is an only child.  Has tried Melatonin in the past.  Has good sleep hygiene.     Review of Systems For ROS see HPI   This visit occurred during the SARS-CoV-2 public health emergency.  Safety protocols were in place, including screening questions prior to the visit, additional usage of staff PPE, and extensive cleaning of exam room while observing appropriate contact time as indicated for disinfecting solutions.      Objective:   Physical Exam AAOx3, NAD NCAT EOMI, PERRL Neck is supple, no thyromegaly or nodularity RRR, normal S1/S2 CTAB Soft, NT/ND, normal BS No swelling of hands/feet +2 PT/DP pulses     Assessment & Plan:

## 2021-05-31 NOTE — Assessment & Plan Note (Signed)
No need to repeat Vit D level today but encouraged her to resume daily OTC supplementation.

## 2021-05-31 NOTE — Assessment & Plan Note (Signed)
New to provider, ongoing for pt.  She reports she will become anemic when she menstruates due to heavy cycles.  Now that she is perimenopausal she is having bleeding Q2 weeks.  She takes iron when she is menstruating but is finding the frequency and the heavy bleeding problematic.  Encouraged her to discuss options w/ GYN but one of the easiest would be to restart low dose OCPs to help better regulate her cycles.  She will think about this and then decide how she wants to proceed.  Will follow.

## 2021-07-26 ENCOUNTER — Telehealth: Payer: Self-pay | Admitting: Urology

## 2021-07-26 NOTE — Telephone Encounter (Signed)
OFFICE DOS - 08/22/21  ? ?HAMMERTOE REPAIR 3RD LEFT --- 29562 ?EXC BENIGN LESION LEFT --- 13086 ? ?[facility] EFFECTIVE DATE - 04/10/21 ? ?PLAN DEDUCTIBLE - $350.00 W/ $350.00 REMAINING ?OUT OF POCKET - $7900.00 W/ $7,875.00 REMAINING ?COINSURANCE - 40% ?COPAY - $0.00 ? ? ?SPOKE WITH MILLIE WITH [facility] AND SHE STATED THAT FOR CPT CODES 57846 AND 96295 NO PRIOR AUTH IS REQUIRED. ? ?REF # [id number] ?

## 2021-08-03 DIAGNOSIS — H43812 Vitreous degeneration, left eye: Secondary | ICD-10-CM | POA: Diagnosis not present

## 2021-08-17 DIAGNOSIS — H43812 Vitreous degeneration, left eye: Secondary | ICD-10-CM | POA: Diagnosis not present

## 2021-08-22 ENCOUNTER — Other Ambulatory Visit (HOSPITAL_BASED_OUTPATIENT_CLINIC_OR_DEPARTMENT_OTHER): Payer: Self-pay

## 2021-08-22 ENCOUNTER — Ambulatory Visit (INDEPENDENT_AMBULATORY_CARE_PROVIDER_SITE_OTHER): Payer: 59 | Admitting: Podiatry

## 2021-08-22 ENCOUNTER — Ambulatory Visit (INDEPENDENT_AMBULATORY_CARE_PROVIDER_SITE_OTHER): Payer: 59

## 2021-08-22 DIAGNOSIS — M2042 Other hammer toe(s) (acquired), left foot: Secondary | ICD-10-CM

## 2021-08-22 DIAGNOSIS — M71379 Other bursal cyst, unspecified ankle and foot: Secondary | ICD-10-CM | POA: Diagnosis not present

## 2021-08-22 DIAGNOSIS — M67472 Ganglion, left ankle and foot: Secondary | ICD-10-CM

## 2021-08-22 MED ORDER — CEPHALEXIN 500 MG PO CAPS
500.0000 mg | ORAL_CAPSULE | Freq: Three times a day (TID) | ORAL | 0 refills | Status: DC
  Filled 2021-08-22: qty 21, 7d supply, fill #0

## 2021-08-22 MED ORDER — IBUPROFEN 800 MG PO TABS
800.0000 mg | ORAL_TABLET | Freq: Three times a day (TID) | ORAL | 0 refills | Status: DC | PRN
  Filled 2021-08-22: qty 30, 10d supply, fill #0

## 2021-08-24 NOTE — Progress Notes (Signed)
Subjective: ?51 year old female presents the office today for excision of skin lesion, arthroplasty of the left third digit.  We have tried treating this previously although has not come back as much is still come back.  This been ongoing for some time.  She was proceed with the procedure today.  No fevers or chills that she reports.  No other concerns. ? ?Objective: ?AAO x3, NAD ?DP/PT pulses palpable bilaterally, CRT less than 3 seconds ?Digital contracture noted of the left third DIPJ and small fluid-filled cyst is still evident today.  No edema, erythema, signs of infection. ?No pain with calf compression, swelling, warmth, erythema ? ?Assessment: ?Skin lesion left third toe ? ?Plan: ?-All treatment options discussed with the patient including all alternatives, risks, complications.  ?-I again discussed the procedure as well as postoperative course.  Alternatives, risks, complications were discussed.  No promises or guarantees given.  All questions answered.  I cleaned the skin with alcohol and 3 cc of lidocaine, Marcaine plain was infiltrated in a digital block fashion.  Once anesthetized she was brought back to the operative room suite where a pneumatic ankle tourniquet was applied to the left side.  Left lower extremities and scrubbed, prepped, draped in normal sterile fashion.  Timeout was performed.  At this time two semi-elliptical incisions were planned along the DIPJ of the left third toe and this was around the area of the skin lesion.  The skin lesion was approximately 0.4 cm in diameter today and this was sent to pathology.  Extensor tendon was then transected and reflected proximally.  Spurring is present off the base of the distal phalanx dorsally which I used a rongeur to remove this.  Sagittal saw was utilized to resect the head of the middle phalanx.  Smooth with a rasp.  Incision was irrigated with saline, Betadine mixture.  Extensor tendon was then reapproximated closed skin was then closed  with nylon.  Betadine was painted over the incision followed by Xeroform and dry sterile dressing.  Tourniquet was released there was found to be an immediate cap refill time to the digits.  Tolerated well to any complications.  Post procedure instructions discussed. ?-Surgical shoe ?-Postop medication sent to the pharmacy ?-Postoperative x-rays were obtained.  3 views were obtained.  No evidence of acute fracture.  Status post arthroplasty of the third digit. ? ?Trula Slade DPM ? ? ?-Patient encouraged to call the office with any questions, concerns, change in symptoms.  ? ?

## 2021-08-26 ENCOUNTER — Encounter: Payer: Self-pay | Admitting: Podiatry

## 2021-08-29 ENCOUNTER — Ambulatory Visit (INDEPENDENT_AMBULATORY_CARE_PROVIDER_SITE_OTHER): Payer: 59 | Admitting: Podiatry

## 2021-08-29 DIAGNOSIS — Z9889 Other specified postprocedural states: Secondary | ICD-10-CM

## 2021-08-29 DIAGNOSIS — M67472 Ganglion, left ankle and foot: Secondary | ICD-10-CM

## 2021-08-29 DIAGNOSIS — M2042 Other hammer toe(s) (acquired), left foot: Secondary | ICD-10-CM

## 2021-08-31 NOTE — Progress Notes (Signed)
Subjective: Ashley Quinn Ashley Quinn is a 51 y.o. is seen today in office s/p left third toe soft tissue mass excision, DIPJ arthroplasty preformed on 08/22/2021.  States that she is doing better and her pain is more controlled.  She is noting surgical shoe, icing and elevating.  No injuries that she reports.  No fevers or chills.    Objective: General: No acute distress, AAOx3  DP/PT pulses palpable 2/4, CRT < 3 sec to all digits.  Protective sensation intact. Motor function intact.  Right foot: Incision is well coapted without any evidence of dehiscence with sutures intact.  Mild rim of erythema going more from inflammation as opposed to infection.  No drainage or pus.  No ascending cellulitis.  No increase in temperature.  Mild swelling present.  Toes in rectus position No other open lesions or pre-ulcerative lesions.  No pain with calf compression, swelling, warmth, erythema.   Assessment and Plan:  Status post left third toe soft tissue mass excision, arthroplasty, doing well with no complications   -Treatment options discussed including all alternatives, risks, and complications -Incision appears to be healing well.  Area was cleaned.  Small amount of antibiotic ointment and a bandage applied.  Discussed that she may change the dressing to keep the area clean and dry.  Hold off on soaking or any of the incision wet for now. -Continue surgical shoe.  Weightbearing as tolerated but limit the nonweightbearing. -Ice/elevation -Pain medication as needed. -Monitor for any clinical signs or symptoms of infection and DVT/PE and directed to call the office immediately should any occur or go to the ER. -Follow-up as scheduled for possible suture removal or sooner if any problems arise. In the meantime, encouraged to call the office with any questions, concerns, change in symptoms.   Celesta Gentile, DPM

## 2021-09-06 ENCOUNTER — Ambulatory Visit (INDEPENDENT_AMBULATORY_CARE_PROVIDER_SITE_OTHER): Payer: 59 | Admitting: Podiatry

## 2021-09-06 DIAGNOSIS — M2042 Other hammer toe(s) (acquired), left foot: Secondary | ICD-10-CM

## 2021-09-06 DIAGNOSIS — M67472 Ganglion, left ankle and foot: Secondary | ICD-10-CM

## 2021-09-06 DIAGNOSIS — Z9889 Other specified postprocedural states: Secondary | ICD-10-CM

## 2021-09-12 NOTE — Progress Notes (Signed)
Subjective: Ashley Quinn is a 51 y.o. is seen today in office s/p left third toe soft tissue mass excision, DIPJ arthroplasty preformed on 08/22/2021.  She presents today for suture removal.  She states that she is ready to get some otherwise been doing well.  No pain.  Denies any fevers or chills.  No other concerns.   Objective: General: No acute distress, AAOx3  DP/PT pulses palpable 2/4, CRT < 3 sec to all digits.  Protective sensation intact. Motor function intact.  Right foot: Incision is well coapted without any evidence of dehiscence with sutures intact.  Moderate erythema appears to be resolved.  There is no drainage or pus.  No fluctuance or crepitation.  No ascending cellulitis.  Toe is in a rectus position.  No pain. No other open lesions or pre-ulcerative lesions.  No pain with calf compression, swelling, warmth, erythema.   Assessment and Plan:  Status post left third toe soft tissue mass excision, arthroplasty, doing well with no complications   -Treatment options discussed including all alternatives, risks, and complications -Remove the sutures of any complications.  Steri-Strips applied for reinforcement.  Antibiotic ointment and a bandage applied.  Discussed that she can start to wash with soap and water, dry thoroughly and apply a similar bandage. -Continue wearing surgical shoe for now but can start to transition to regular shoe as tolerated once the incision continues to heal more. -Monitor for any clinical signs or symptoms of infection and directed to call the office immediately should any occur or go to the ER.  Return in about 3 weeks (around 09/27/2021).  Trula Slade DPM

## 2021-09-20 ENCOUNTER — Encounter: Payer: 59 | Admitting: Podiatry

## 2021-09-30 DIAGNOSIS — H5213 Myopia, bilateral: Secondary | ICD-10-CM | POA: Diagnosis not present

## 2021-10-05 DIAGNOSIS — M13841 Other specified arthritis, right hand: Secondary | ICD-10-CM | POA: Diagnosis not present

## 2021-10-17 ENCOUNTER — Encounter: Payer: Self-pay | Admitting: Podiatry

## 2021-10-17 NOTE — Progress Notes (Unsigned)
51 y.o. G55P2002 Married Caucasian female here for annual exam.    PCP:   Annye Asa, MD  No LMP recorded.           Sexually active: {yes no:314532}  The current method of family planning is vasectomy.    Exercising: {yes no:314532}  {types:19826} Smoker:  no  Health Maintenance: Pap:   09-18-18 Neg:Neg HR HPV, 07-01-15 Neg:Neg HR HPV; 06-30-14 Neg  History of abnormal Pap:  Yes,  2002 AGUS Pap; 05/26/10 LGSIL colpo bx done atypia with HPV effect MMG:  02-03-21 Lt.Br.possible distortion:Rt.Br.neg. Diag.Lt.Br./neg/Birads1/screening 1 year Colonoscopy:  NEVER.  10-20-20 Neg cologuard BMD:   N/A  Result  N/A TDaP:  UTD through work Gardasil:   no HIV: Neg in past Hep C: never Screening Labs:  Hb today: ***, Urine today: ***   reports that she has never smoked. She has never used smokeless tobacco. She reports current alcohol use of about 3.0 - 4.0 standard drinks of alcohol per week. She reports that she does not use drugs.  Past Medical History:  Diagnosis Date   Abnormal Pap smear of cervix    Anemia 2020   COVID 08/2020   GERD (gastroesophageal reflux disease)    occ.   HSV-1 (herpes simplex virus 1) infection    Melanoma ([place]) 11/2012   left thigh   PONV (postoperative nausea and vomiting)     Past Surgical History:  Procedure Laterality Date   COLPOSCOPY  05/2010   Mild Dysplasia   DILITATION & CURRETTAGE/HYSTROSCOPY WITH NOVASURE ABLATION N/A 11/28/2016   Procedure: DILATATION & CURETTAGE/HYSTEROSCOPY WITH ROLLER BALL ABLATION W/BIPOLAR CAUTERY AND ATTEMPTED NOVASURE ABLATION;  Surgeon: Nunzio Cobbs, MD;  Location: [facility];  Service: Gynecology;  Laterality: N/A;   ENDOSCOPIC VEIN LASER TREATMENT Right 07/25/2016   melanoma removal  11/2012   -left thigh   TENDON TRANSFER Left 01/13/2020   WRIST FRACTURE SURGERY Left     Current Outpatient Medications  Medication Sig Dispense Refill   B Complex Vitamins (B COMPLEX 1 PO)      cephALEXin (KEFLEX)  500 MG capsule Take 1 capsule (500 mg total) by mouth 3 (three) times daily. 21 capsule 0   cetirizine (ZYRTEC) 10 MG tablet Take 10 mg by mouth daily as needed for allergies.      cycloSPORINE (RESTASIS) 0.05 % ophthalmic emulsion INSTILL 1 DROP INTO EACH EYE EVERY 12 HOURS 180 each 4   ibuprofen (ADVIL) 800 MG tablet Take 1 tablet (800 mg total) by mouth every 8 (eight) hours as needed. 30 tablet 0   Multiple Vitamin (MULTIVITAMIN) tablet Take 1 tablet by mouth daily.     Nutritional Supplements (ESTROVEN PO) Take 1 tablet by mouth daily.     TURMERIC PO Take by mouth.     valACYclovir (VALTREX) 1000 MG tablet Take 2 tablets now, repeat in 12 hours as needed for an outbreak. 20 tablet 1   No current facility-administered medications for this visit.    Family History  Problem Relation Age of Onset   Hypertension Mother    Osteoporosis Mother    Breast cancer Mother        postmenopausal, age 57   Hyperlipidemia Father    Prostate cancer Father    Cancer Father        bladder ca   Heart disease Paternal Grandmother     Review of Systems  Exam:   There were no vitals taken for this visit.    General  appearance: alert, cooperative and appears stated age Head: normocephalic, without obvious abnormality, atraumatic Neck: no adenopathy, supple, symmetrical, trachea midline and thyroid normal to inspection and palpation Lungs: clear to auscultation bilaterally Breasts: normal appearance, no masses or tenderness, No nipple retraction or dimpling, No nipple discharge or bleeding, No axillary adenopathy Heart: regular rate and rhythm Abdomen: soft, non-tender; no masses, no organomegaly Extremities: extremities normal, atraumatic, no cyanosis or edema Skin: skin color, texture, turgor normal. No rashes or lesions Lymph nodes: cervical, supraclavicular, and axillary nodes normal. Neurologic: grossly normal  Pelvic: External genitalia:  no lesions              No abnormal inguinal  nodes palpated.              Urethra:  normal appearing urethra with no masses, tenderness or lesions              Bartholins and Skenes: normal                 Vagina: normal appearing vagina with normal color and discharge, no lesions              Cervix: no lesions              Pap taken: {yes no:314532} Bimanual Exam:  Uterus:  normal size, contour, position, consistency, mobility, non-tender              Adnexa: no mass, fullness, tenderness              Rectal exam: {yes no:314532}.  Confirms.              Anus:  normal sphincter tone, no lesions  Chaperone was present for exam:  ***  Assessment:   Well woman visit with gynecologic exam.   Plan: Mammogram screening discussed. Self breast awareness reviewed. Pap and HR HPV as above. Guidelines for Calcium, Vitamin D, regular exercise program including cardiovascular and weight bearing exercise.   Follow up annually and prn.   Additional counseling given.  {yes [id number]. _______ minutes face to face time of which over 50% was spent in counseling.    After visit summary provided.

## 2021-10-19 ENCOUNTER — Other Ambulatory Visit (HOSPITAL_BASED_OUTPATIENT_CLINIC_OR_DEPARTMENT_OTHER): Payer: Self-pay

## 2021-10-19 ENCOUNTER — Encounter: Payer: Self-pay | Admitting: Obstetrics and Gynecology

## 2021-10-19 ENCOUNTER — Ambulatory Visit (INDEPENDENT_AMBULATORY_CARE_PROVIDER_SITE_OTHER): Payer: 59 | Admitting: Obstetrics and Gynecology

## 2021-10-19 VITALS — BP 100/68 | HR 66 | Ht 69.0 in | Wt 159.0 lb

## 2021-10-19 DIAGNOSIS — Z01419 Encounter for gynecological examination (general) (routine) without abnormal findings: Secondary | ICD-10-CM | POA: Diagnosis not present

## 2021-10-19 MED ORDER — NORETHINDRONE 0.35 MG PO TABS
1.0000 | ORAL_TABLET | Freq: Every day | ORAL | 3 refills | Status: DC
  Filled 2021-10-19: qty 84, 84d supply, fill #0

## 2021-10-19 MED ORDER — VALACYCLOVIR HCL 1 G PO TABS
ORAL_TABLET | ORAL | 1 refills | Status: DC
  Filled 2021-10-19 – 2022-01-24 (×2): qty 20, 10d supply, fill #0
  Filled 2022-06-08 – 2022-06-09 (×2): qty 20, 10d supply, fill #1

## 2021-11-30 ENCOUNTER — Other Ambulatory Visit: Payer: Self-pay

## 2021-11-30 ENCOUNTER — Ambulatory Visit (INDEPENDENT_AMBULATORY_CARE_PROVIDER_SITE_OTHER): Payer: 59 | Admitting: Family Medicine

## 2021-11-30 ENCOUNTER — Encounter: Payer: Self-pay | Admitting: Family Medicine

## 2021-11-30 VITALS — BP 108/70 | HR 82 | Temp 98.0°F | Resp 18 | Ht 69.0 in | Wt 160.6 lb

## 2021-11-30 DIAGNOSIS — Z114 Encounter for screening for human immunodeficiency virus [HIV]: Secondary | ICD-10-CM

## 2021-11-30 DIAGNOSIS — Z0000 Encounter for general adult medical examination without abnormal findings: Secondary | ICD-10-CM

## 2021-11-30 DIAGNOSIS — E559 Vitamin D deficiency, unspecified: Secondary | ICD-10-CM

## 2021-11-30 DIAGNOSIS — Z1159 Encounter for screening for other viral diseases: Secondary | ICD-10-CM

## 2021-11-30 LAB — CBC WITH DIFFERENTIAL/PLATELET
Basophils Absolute: 0 10*3/uL (ref 0.0–0.1)
Basophils Relative: 0.7 % (ref 0.0–3.0)
Eosinophils Absolute: 0.1 10*3/uL (ref 0.0–0.7)
Eosinophils Relative: 3.5 % (ref 0.0–5.0)
HCT: 35.3 % — ABNORMAL LOW (ref 36.0–46.0)
Hemoglobin: 11.7 g/dL — ABNORMAL LOW (ref 12.0–15.0)
Lymphocytes Relative: 26.2 % (ref 12.0–46.0)
Lymphs Abs: 1 10*3/uL (ref 0.7–4.0)
MCHC: 33 g/dL (ref 30.0–36.0)
MCV: 80 fl (ref 78.0–100.0)
Monocytes Absolute: 0.3 10*3/uL (ref 0.1–1.0)
Monocytes Relative: 8.7 % (ref 3.0–12.0)
Neutro Abs: 2.2 10*3/uL (ref 1.4–7.7)
Neutrophils Relative %: 60.9 % (ref 43.0–77.0)
Platelets: 170 10*3/uL (ref 150.0–400.0)
RBC: 4.41 Mil/uL (ref 3.87–5.11)
RDW: 14 % (ref 11.5–15.5)
WBC: 3.6 10*3/uL — ABNORMAL LOW (ref 4.0–10.5)

## 2021-11-30 LAB — HEPATIC FUNCTION PANEL
ALT: 13 U/L (ref 0–35)
AST: 19 U/L (ref 0–37)
Albumin: 4.2 g/dL (ref 3.5–5.2)
Alkaline Phosphatase: 51 U/L (ref 39–117)
Bilirubin, Direct: 0.1 mg/dL (ref 0.0–0.3)
Total Bilirubin: 0.6 mg/dL (ref 0.2–1.2)
Total Protein: 6.8 g/dL (ref 6.0–8.3)

## 2021-11-30 LAB — LIPID PANEL
Cholesterol: 186 mg/dL (ref 0–200)
HDL: 84.2 mg/dL (ref >39.00)
LDL Cholesterol: 94 mg/dL (ref 0–99)
NonHDL: 101.97
Total CHOL/HDL Ratio: 2
Triglycerides: 41 mg/dL (ref 0.0–149.0)
VLDL: 8.2 mg/dL (ref 0.0–40.0)

## 2021-11-30 LAB — BASIC METABOLIC PANEL
BUN: 18 mg/dL (ref 6–23)
CO2: 26 mEq/L (ref 19–32)
Calcium: 9 mg/dL (ref 8.4–10.5)
Chloride: 102 mEq/L (ref 96–112)
Creatinine, Ser: 0.76 mg/dL (ref 0.40–1.20)
GFR: 90.24 mL/min (ref >60.00)
Glucose, Bld: 88 mg/dL (ref 70–99)
Potassium: 4.1 mEq/L (ref 3.5–5.1)
Sodium: 135 mEq/L (ref 135–145)

## 2021-11-30 LAB — VITAMIN D 25 HYDROXY (VIT D DEFICIENCY, FRACTURES): VITD: 43.99 ng/mL (ref 30.00–100.00)

## 2021-11-30 LAB — TSH: TSH: 1.13 u[IU]/mL (ref 0.35–5.50)

## 2021-11-30 NOTE — Assessment & Plan Note (Signed)
Pt's PE WNL.  UTD on pap, mammo, cologuard, Tdap.  Check labs.  Anticipatory guidance provided.

## 2021-11-30 NOTE — Progress Notes (Signed)
   Subjective:    Patient ID: Ashley Quinn, female    DOB: 06-28-1970, 51 y.o.   MRN: [id number]  HPI CPE- UTD on pap, cologuard, Tdap, mammo  Patient Care Team    Relationship Specialty Notifications Start End  Midge Minium, MD PCP - General Family Medicine  05/23/21   Nunzio Cobbs, MD Consulting Physician Obstetrics and Gynecology  09/30/19     Health Maintenance  Topic Date Due   HIV Screening  Never done   Hepatitis C Screening  Never done   Zoster Vaccines- Shingrix (1 of 2) Never done   INFLUENZA VACCINE  11/08/2021   MAMMOGRAM  02/04/2023   PAP SMEAR-Modifier  09/18/2023   COLONOSCOPY (Pts 45-36yr Insurance coverage will need to be confirmed)  11/12/2030   TETANUS/TDAP  05/24/2031   COVID-19 Vaccine  Completed   HPV VACCINES  Aged Out      Review of Systems Patient reports no vision/ hearing changes, adenopathy,fever, weight change,  persistant/recurrent hoarseness , swallowing issues, chest pain, palpitations, edema, persistant/recurrent cough, hemoptysis, dyspnea (rest/exertional/paroxysmal nocturnal), gastrointestinal bleeding (melena, rectal bleeding), abdominal pain, significant heartburn, bowel changes, GU symptoms (dysuria, hematuria, incontinence), Gyn symptoms (abnormal  bleeding, pain),  syncope, focal weakness, memory loss, numbness & tingling, skin/hair/nail changes, abnormal bruising or bleeding, anxiety, or depression.     Objective:   Physical Exam General Appearance:    Alert, cooperative, no distress, appears stated age  Head:    Normocephalic, without obvious abnormality, atraumatic  Eyes:    PERRL, conjunctiva/corneas clear, EOM's intact both eyes  Ears:    Normal TM's and external ear canals, both ears  Nose:   Nares normal, septum midline, mucosa normal, no drainage    or sinus tenderness  Throat:   Lips, mucosa, and tongue normal; teeth and gums normal  Neck:   Supple, symmetrical, trachea midline, no adenopathy;     Thyroid: no enlargement/tenderness/nodules  Back:     Symmetric, no curvature, ROM normal, no CVA tenderness  Lungs:     Clear to auscultation bilaterally, respirations unlabored  Chest Wall:    No tenderness or deformity   Heart:    Regular rate and rhythm, S1 and S2 normal, no murmur, rub   or gallop  Breast Exam:    Deferred to GYN  Abdomen:     Soft, non-tender, bowel sounds active all four quadrants,    no masses, no organomegaly  Genitalia:    Deferred to GYN  Rectal:    Extremities:   Extremities normal, atraumatic, no cyanosis or edema  Pulses:   2+ and symmetric all extremities  Skin:   Skin color, texture, turgor normal, no rashes or lesions  Lymph nodes:   Cervical, supraclavicular, and axillary nodes normal  Neurologic:   CNII-XII intact, normal strength, sensation and reflexes    throughout          Assessment & Plan:

## 2021-11-30 NOTE — Progress Notes (Signed)
nnua

## 2021-11-30 NOTE — Assessment & Plan Note (Signed)
Check labs and replete prn. 

## 2021-11-30 NOTE — Patient Instructions (Signed)
Follow up in 1 year or as needed We'll notify you of your lab results and make any changes if needed Continue to work on healthy diet and regular exercise- you look great!!! Call with any questions or concerns Stay safe!  Stay healthy! Enjoy the rest of your summer!!

## 2021-12-01 LAB — HEPATITIS C ANTIBODY: Hepatitis C Ab: NONREACTIVE

## 2021-12-01 LAB — HIV ANTIBODY (ROUTINE TESTING W REFLEX): HIV 1&2 Ab, 4th Generation: NONREACTIVE

## 2021-12-03 ENCOUNTER — Encounter: Payer: Self-pay | Admitting: Obstetrics and Gynecology

## 2022-01-24 ENCOUNTER — Other Ambulatory Visit (HOSPITAL_BASED_OUTPATIENT_CLINIC_OR_DEPARTMENT_OTHER): Payer: Self-pay

## 2022-01-25 ENCOUNTER — Encounter: Payer: Self-pay | Admitting: Family Medicine

## 2022-01-27 ENCOUNTER — Ambulatory Visit (INDEPENDENT_AMBULATORY_CARE_PROVIDER_SITE_OTHER): Payer: 59 | Admitting: Family Medicine

## 2022-01-27 ENCOUNTER — Encounter: Payer: Self-pay | Admitting: Family Medicine

## 2022-01-27 VITALS — BP 122/70 | HR 78 | Temp 98.9°F | Resp 16 | Ht 69.0 in | Wt 164.5 lb

## 2022-01-27 DIAGNOSIS — L659 Nonscarring hair loss, unspecified: Secondary | ICD-10-CM | POA: Diagnosis not present

## 2022-01-27 DIAGNOSIS — R5383 Other fatigue: Secondary | ICD-10-CM

## 2022-01-27 LAB — CBC WITH DIFFERENTIAL/PLATELET
Basophils Absolute: 0.1 10*3/uL (ref 0.0–0.1)
Basophils Relative: 0.7 % (ref 0.0–3.0)
Eosinophils Absolute: 0.2 10*3/uL (ref 0.0–0.7)
Eosinophils Relative: 2.6 % (ref 0.0–5.0)
HCT: 35 % — ABNORMAL LOW (ref 36.0–46.0)
Hemoglobin: 11.3 g/dL — ABNORMAL LOW (ref 12.0–15.0)
Lymphocytes Relative: 20 % (ref 12.0–46.0)
Lymphs Abs: 1.5 10*3/uL (ref 0.7–4.0)
MCHC: 32.2 g/dL (ref 30.0–36.0)
MCV: 75.9 fl — ABNORMAL LOW (ref 78.0–100.0)
Monocytes Absolute: 0.6 10*3/uL (ref 0.1–1.0)
Monocytes Relative: 7.9 % (ref 3.0–12.0)
Neutro Abs: 5.3 10*3/uL (ref 1.4–7.7)
Neutrophils Relative %: 68.8 % (ref 43.0–77.0)
Platelets: 219 10*3/uL (ref 150.0–400.0)
RBC: 4.61 Mil/uL (ref 3.87–5.11)
RDW: 14.5 % (ref 11.5–15.5)
WBC: 7.7 10*3/uL (ref 4.0–10.5)

## 2022-01-27 LAB — HEPATIC FUNCTION PANEL
ALT: 16 U/L (ref 0–35)
AST: 21 U/L (ref 0–37)
Albumin: 4.3 g/dL (ref 3.5–5.2)
Alkaline Phosphatase: 66 U/L (ref 39–117)
Bilirubin, Direct: 0.1 mg/dL (ref 0.0–0.3)
Total Bilirubin: 0.3 mg/dL (ref 0.2–1.2)
Total Protein: 6.9 g/dL (ref 6.0–8.3)

## 2022-01-27 LAB — BASIC METABOLIC PANEL
BUN: 15 mg/dL (ref 6–23)
CO2: 28 mEq/L (ref 19–32)
Calcium: 9.4 mg/dL (ref 8.4–10.5)
Chloride: 103 mEq/L (ref 96–112)
Creatinine, Ser: 0.69 mg/dL (ref 0.40–1.20)
GFR: 99.84 mL/min (ref >60.00)
Glucose, Bld: 91 mg/dL (ref 70–99)
Potassium: 4.3 mEq/L (ref 3.5–5.1)
Sodium: 138 mEq/L (ref 135–145)

## 2022-01-27 LAB — TSH: TSH: 1 u[IU]/mL (ref 0.35–5.50)

## 2022-01-27 LAB — VITAMIN D 25 HYDROXY (VIT D DEFICIENCY, FRACTURES): VITD: 30.72 ng/mL (ref 30.00–100.00)

## 2022-01-27 NOTE — Patient Instructions (Signed)
Follow up as needed or as scheduled We'll notify you of your lab results and make any changes if needed Keep up the good work!  You look great!!! Call with any questions or concerns Hang in there!!!

## 2022-01-27 NOTE — Progress Notes (Signed)
   Subjective:    Patient ID: Ashley Quinn, female    DOB: [birth year], 51 y.o.   MRN: [id number]  HPI Hair loss- pt reports this started ~9 months ago.  Washes hair every other day.  Hair dresser has noticed some thinning at the crown.  Pt reports dry and brittle nails.  Feels like she is pale.  + fatigue.     Review of Systems For ROS see HPI     Objective:   Physical Exam Vitals reviewed.  Constitutional:      General: She is not in acute distress.    Appearance: Normal appearance. She is not ill-appearing.  HENT:     Head: Normocephalic and atraumatic.  Eyes:     Extraocular Movements: Extraocular movements intact.     Conjunctiva/sclera: Conjunctivae normal.     Pupils: Pupils are equal, round, and reactive to light.  Neck:     Comments: No thyroid abnormality Cardiovascular:     Rate and Rhythm: Normal rate and regular rhythm.  Pulmonary:     Effort: Pulmonary effort is normal. No respiratory distress.     Breath sounds: Normal breath sounds. No wheezing or rhonchi.  Musculoskeletal:     Cervical back: Normal range of motion and neck supple.  Lymphadenopathy:     Cervical: No cervical adenopathy.  Skin:    General: Skin is warm and dry.  Neurological:     General: No focal deficit present.     Mental Status: She is alert and oriented to person, place, and time.  Psychiatric:        Mood and Affect: Mood normal.        Behavior: Behavior normal.        Thought Content: Thought content normal.           Assessment & Plan:  Hair loss/fatigue- new.  Pt has hx of menorrhagia and is no longer on OCPs.  Suspect that her current sxs of hair loss and fatigue are due to anemia/iron deficiency.  Check labs to assess iron levels, Hgb, and other possible causes such as thyroid.  Will treat any abnormalities if present.  Pt expressed understanding and is in agreement w/ plan.

## 2022-01-28 LAB — IRON,TIBC AND FERRITIN PANEL
%SAT: 3 % (calc) — ABNORMAL LOW (ref 16–45)
Ferritin: 5 ng/mL — ABNORMAL LOW (ref 16–232)
Iron: 15 ug/dL — ABNORMAL LOW (ref 45–160)
TIBC: 431 mcg/dL (calc) (ref 250–450)

## 2022-01-30 NOTE — Progress Notes (Signed)
Pt seen results Via my chart  

## 2022-02-24 ENCOUNTER — Other Ambulatory Visit (HOSPITAL_BASED_OUTPATIENT_CLINIC_OR_DEPARTMENT_OTHER): Payer: Self-pay

## 2022-02-26 ENCOUNTER — Other Ambulatory Visit (HOSPITAL_BASED_OUTPATIENT_CLINIC_OR_DEPARTMENT_OTHER): Payer: Self-pay

## 2022-02-26 MED ORDER — CYCLOSPORINE 0.05 % OP EMUL
1.0000 [drp] | Freq: Two times a day (BID) | OPHTHALMIC | 4 refills | Status: DC
  Filled 2022-02-26: qty 180, 90d supply, fill #0

## 2022-02-27 ENCOUNTER — Other Ambulatory Visit (HOSPITAL_BASED_OUTPATIENT_CLINIC_OR_DEPARTMENT_OTHER): Payer: Self-pay

## 2022-02-27 DIAGNOSIS — M13841 Other specified arthritis, right hand: Secondary | ICD-10-CM | POA: Diagnosis not present

## 2022-03-07 ENCOUNTER — Encounter: Payer: Self-pay | Admitting: Family Medicine

## 2022-03-10 ENCOUNTER — Other Ambulatory Visit: Payer: Self-pay | Admitting: Obstetrics and Gynecology

## 2022-03-10 DIAGNOSIS — Z1231 Encounter for screening mammogram for malignant neoplasm of breast: Secondary | ICD-10-CM

## 2022-03-16 DIAGNOSIS — D1801 Hemangioma of skin and subcutaneous tissue: Secondary | ICD-10-CM | POA: Diagnosis not present

## 2022-03-16 DIAGNOSIS — L814 Other melanin hyperpigmentation: Secondary | ICD-10-CM | POA: Diagnosis not present

## 2022-03-16 DIAGNOSIS — Z85820 Personal history of malignant melanoma of skin: Secondary | ICD-10-CM | POA: Diagnosis not present

## 2022-03-16 DIAGNOSIS — D2262 Melanocytic nevi of left upper limb, including shoulder: Secondary | ICD-10-CM | POA: Diagnosis not present

## 2022-03-16 DIAGNOSIS — D225 Melanocytic nevi of trunk: Secondary | ICD-10-CM | POA: Diagnosis not present

## 2022-03-16 DIAGNOSIS — L738 Other specified follicular disorders: Secondary | ICD-10-CM | POA: Diagnosis not present

## 2022-03-16 DIAGNOSIS — L918 Other hypertrophic disorders of the skin: Secondary | ICD-10-CM | POA: Diagnosis not present

## 2022-04-26 ENCOUNTER — Other Ambulatory Visit (HOSPITAL_COMMUNITY): Payer: Self-pay

## 2022-04-26 MED ORDER — NUCYNTA 50 MG PO TABS
50.0000 mg | ORAL_TABLET | ORAL | 0 refills | Status: DC
  Filled 2022-04-26: qty 30, 5d supply, fill #0

## 2022-05-04 ENCOUNTER — Ambulatory Visit
Admission: RE | Admit: 2022-05-04 | Discharge: 2022-05-04 | Disposition: A | Discharge status: Home / Self Care | Payer: 59 | Source: Ambulatory Visit | Attending: Obstetrics and Gynecology | Admitting: Obstetrics and Gynecology

## 2022-05-04 DIAGNOSIS — Z1231 Encounter for screening mammogram for malignant neoplasm of breast: Secondary | ICD-10-CM | POA: Diagnosis not present

## 2022-05-15 ENCOUNTER — Encounter (HOSPITAL_COMMUNITY): Payer: Self-pay | Admitting: Orthopedic Surgery

## 2022-05-15 ENCOUNTER — Other Ambulatory Visit: Payer: Self-pay

## 2022-05-15 NOTE — Progress Notes (Signed)
PCP - Midge Minium, MD  ERAS Protcol - clears until 1230  Anesthesia review: N  Patient verbally denies any shortness of breath, fever, cough and chest pain during phone call   -------------  SDW INSTRUCTIONS given:  Your procedure is scheduled on 05/16/22.  Report to [facility] Main Entrance "A" at 1300 P.M., and check in at the Admitting office.  Call this number if you have problems the morning of surgery:  [phone]   Remember:  Do not eat after midnight the night before your surgery  You may drink clear liquids until 1230 the afternoon of your surgery.   Clear liquids allowed are: Water, Non-Citrus Juices (without pulp), Carbonated Beverages, Clear Tea, Black Coffee Only, and Gatorade    Take these medicines the morning of surgery with A SIP OF WATER  cycloSPORINE (RESTASIS)  cetirizine (ZYRTEC)-if needed valACYclovir (VALTREX)-if needed  As of today, STOP taking any Aspirin (unless otherwise instructed by your surgeon) Aleve, Naproxen, Ibuprofen, Motrin, Advil, Goody's, BC's, all herbal medications, fish oil, and all vitamins.                      Do not wear jewelry, make up, or nail polish            Do not wear lotions, powders, perfumes/colognes, or deodorant.            Do not shave 48 hours prior to surgery.  Men may shave face and neck.            Do not bring valuables to the hospital.            [facility] is not responsible for any belongings or valuables.  Do NOT Smoke (Tobacco/Vaping) 24 hours prior to your procedure If you use a CPAP at night, you may bring all equipment for your overnight stay.   Contacts, glasses, dentures or bridgework may not be worn into surgery.      For patients admitted to the hospital, discharge time will be determined by your treatment team.   Patients discharged the day of surgery will not be allowed to drive home, and someone needs to stay with them for 24 hours.    Special instructions:   [facility]- Preparing  For Surgery  Before surgery, you can play an important role. Because skin is not sterile, your skin needs to be as free of germs as possible. You can reduce the number of germs on your skin by washing with CHG (chlorahexidine gluconate) Soap before surgery.  CHG is an antiseptic cleaner which kills germs and bonds with the skin to continue killing germs even after washing.    Oral Hygiene is also important to reduce your risk of infection.  Remember - BRUSH YOUR TEETH THE MORNING OF SURGERY WITH YOUR REGULAR TOOTHPASTE  Please do not use if you have an allergy to CHG or antibacterial soaps. If your skin becomes reddened/irritated stop using the CHG.  Do not shave (including legs and underarms) for at least 48 hours prior to first CHG shower. It is OK to shave your face.  Please follow these instructions carefully.   Shower the NIGHT BEFORE SURGERY and the MORNING OF SURGERY with DIAL Soap.   Pat yourself dry with a CLEAN TOWEL.  Wear CLEAN PAJAMAS to bed the night before surgery  Place CLEAN SHEETS on your bed the night of your first shower and DO NOT SLEEP WITH PETS.   Day of Surgery: Please shower  morning of surgery  Wear Clean/Comfortable clothing the morning of surgery Do not apply any deodorants/lotions.   Remember to brush your teeth WITH YOUR REGULAR TOOTHPASTE.   Questions were answered. Patient verbalized understanding of instructions.

## 2022-05-16 ENCOUNTER — Other Ambulatory Visit: Payer: Self-pay

## 2022-05-16 ENCOUNTER — Encounter (HOSPITAL_COMMUNITY): Payer: Self-pay | Admitting: Orthopedic Surgery

## 2022-05-16 ENCOUNTER — Encounter (HOSPITAL_COMMUNITY)
Admission: RE | Disposition: A | Discharge status: Home / Self Care | Payer: Self-pay | Source: Home / Self Care | Attending: Orthopedic Surgery

## 2022-05-16 ENCOUNTER — Ambulatory Visit (HOSPITAL_COMMUNITY)
Admission: RE | Admit: 2022-05-16 | Discharge: 2022-05-16 | Disposition: A | Discharge status: Home / Self Care | Payer: 59 | Attending: Orthopedic Surgery | Admitting: Orthopedic Surgery

## 2022-05-16 ENCOUNTER — Ambulatory Visit (HOSPITAL_COMMUNITY): Payer: 59 | Admitting: Vascular Surgery

## 2022-05-16 ENCOUNTER — Ambulatory Visit (HOSPITAL_COMMUNITY): Payer: 59

## 2022-05-16 ENCOUNTER — Other Ambulatory Visit (HOSPITAL_COMMUNITY): Payer: Self-pay

## 2022-05-16 ENCOUNTER — Ambulatory Visit (HOSPITAL_BASED_OUTPATIENT_CLINIC_OR_DEPARTMENT_OTHER): Payer: 59 | Admitting: Vascular Surgery

## 2022-05-16 DIAGNOSIS — K219 Gastro-esophageal reflux disease without esophagitis: Secondary | ICD-10-CM | POA: Diagnosis not present

## 2022-05-16 DIAGNOSIS — M1811 Unilateral primary osteoarthritis of first carpometacarpal joint, right hand: Secondary | ICD-10-CM | POA: Diagnosis not present

## 2022-05-16 DIAGNOSIS — M1810 Unilateral primary osteoarthritis of first carpometacarpal joint, unspecified hand: Secondary | ICD-10-CM | POA: Diagnosis not present

## 2022-05-16 DIAGNOSIS — Z79899 Other long term (current) drug therapy: Secondary | ICD-10-CM | POA: Insufficient documentation

## 2022-05-16 HISTORY — PX: FINGER ARTHROSCOPY WITH CARPOMETACARPEL (CMC) ARTHROPLASTY: SHX5629

## 2022-05-16 LAB — CBC
HCT: 43.6 % (ref 36.0–46.0)
Hemoglobin: 14 g/dL (ref 12.0–15.0)
MCH: 25.8 pg — ABNORMAL LOW (ref 26.0–34.0)
MCHC: 32.1 g/dL (ref 30.0–36.0)
MCV: 80.4 fL (ref 80.0–100.0)
Platelets: 199 10*3/uL (ref 150–400)
RBC: 5.42 MIL/uL — ABNORMAL HIGH (ref 3.87–5.11)
RDW: 16.8 % — ABNORMAL HIGH (ref 11.5–15.5)
WBC: 5.6 10*3/uL (ref 4.0–10.5)
nRBC: 0 % (ref 0.0–0.2)

## 2022-05-16 LAB — POCT PREGNANCY, URINE: Preg Test, Ur: NEGATIVE

## 2022-05-16 SURGERY — FINGER ARTHROSCOPY WITH CARPOMETACARPEL (CMC) ARTHROPLASTY
Anesthesia: Monitor Anesthesia Care | Site: Finger | Laterality: Right

## 2022-05-16 MED ORDER — FENTANYL CITRATE (PF) 100 MCG/2ML IJ SOLN
INTRAMUSCULAR | Status: AC
  Administered 2022-05-16: 100 ug via INTRAVENOUS
  Filled 2022-05-16: qty 2

## 2022-05-16 MED ORDER — PHENYLEPHRINE 80 MCG/ML (10ML) SYRINGE FOR IV PUSH (FOR BLOOD PRESSURE SUPPORT)
PREFILLED_SYRINGE | INTRAVENOUS | Status: AC
  Filled 2022-05-16: qty 10

## 2022-05-16 MED ORDER — MIDAZOLAM HCL 2 MG/2ML IJ SOLN: 2.0000 mg | Freq: Once | INTRAMUSCULAR | Status: AC

## 2022-05-16 MED ORDER — DEXAMETHASONE SODIUM PHOSPHATE 10 MG/ML IJ SOLN
INTRAMUSCULAR | Status: DC | PRN
  Administered 2022-05-16: 5 mg

## 2022-05-16 MED ORDER — PROPOFOL 1000 MG/100ML IV EMUL
INTRAVENOUS | Status: AC
  Filled 2022-05-16: qty 100

## 2022-05-16 MED ORDER — BUPIVACAINE HCL (PF) 0.25 % IJ SOLN
INTRAMUSCULAR | Status: AC
  Filled 2022-05-16: qty 30

## 2022-05-16 MED ORDER — LIDOCAINE 2% (20 MG/ML) 5 ML SYRINGE
INTRAMUSCULAR | Status: AC
  Filled 2022-05-16: qty 5

## 2022-05-16 MED ORDER — ONDANSETRON HCL 4 MG/2ML IJ SOLN
INTRAMUSCULAR | Status: AC
  Filled 2022-05-16: qty 2

## 2022-05-16 MED ORDER — PROMETHAZINE HCL 25 MG/ML IJ SOLN: 6.2500 mg | INTRAMUSCULAR | Status: DC | PRN

## 2022-05-16 MED ORDER — MIDAZOLAM HCL 2 MG/2ML IJ SOLN
INTRAMUSCULAR | Status: DC | PRN
  Administered 2022-05-16: 2 mg via INTRAVENOUS

## 2022-05-16 MED ORDER — PHENYLEPHRINE 80 MCG/ML (10ML) SYRINGE FOR IV PUSH (FOR BLOOD PRESSURE SUPPORT)
PREFILLED_SYRINGE | INTRAVENOUS | Status: DC | PRN
  Administered 2022-05-16 (×3): 240 ug via INTRAVENOUS
  Administered 2022-05-16 (×2): 160 ug via INTRAVENOUS
  Administered 2022-05-16: 240 ug via INTRAVENOUS

## 2022-05-16 MED ORDER — LACTATED RINGERS IV SOLN: INTRAVENOUS | Status: DC

## 2022-05-16 MED ORDER — PROPOFOL 500 MG/50ML IV EMUL
INTRAVENOUS | Status: DC | PRN
  Administered 2022-05-16: 150 ug/kg/min via INTRAVENOUS

## 2022-05-16 MED ORDER — CEFAZOLIN SODIUM-DEXTROSE 2-4 GM/100ML-% IV SOLN
2.0000 g | INTRAVENOUS | Status: AC
  Administered 2022-05-16: 2 g via INTRAVENOUS
  Filled 2022-05-16: qty 100

## 2022-05-16 MED ORDER — 0.9 % SODIUM CHLORIDE (POUR BTL) OPTIME
TOPICAL | Status: DC | PRN
  Administered 2022-05-16: 1000 mL

## 2022-05-16 MED ORDER — METHOCARBAMOL 500 MG PO TABS
500.0000 mg | ORAL_TABLET | Freq: Four times a day (QID) | ORAL | 0 refills | Status: DC | PRN
  Filled 2022-05-16: qty 40, 10d supply, fill #0

## 2022-05-16 MED ORDER — CHLORHEXIDINE GLUCONATE 0.12 % MT SOLN
15.0000 mL | Freq: Once | OROMUCOSAL | Status: AC
  Administered 2022-05-16: 15 mL via OROMUCOSAL
  Filled 2022-05-16: qty 15

## 2022-05-16 MED ORDER — LIDOCAINE 2% (20 MG/ML) 5 ML SYRINGE
INTRAMUSCULAR | Status: DC | PRN
  Administered 2022-05-16: 30 mg via INTRAVENOUS

## 2022-05-16 MED ORDER — ACETAMINOPHEN 500 MG PO TABS
1000.0000 mg | ORAL_TABLET | Freq: Once | ORAL | Status: AC
  Administered 2022-05-16: 1000 mg via ORAL
  Filled 2022-05-16: qty 2

## 2022-05-16 MED ORDER — ONDANSETRON HCL 4 MG PO TABS
8.0000 mg | ORAL_TABLET | Freq: Three times a day (TID) | ORAL | 0 refills | Status: DC | PRN
  Filled 2022-05-16: qty 15, 3d supply, fill #0

## 2022-05-16 MED ORDER — MIDAZOLAM HCL 2 MG/2ML IJ SOLN
INTRAMUSCULAR | Status: AC
  Administered 2022-05-16: 2 mg via INTRAVENOUS
  Filled 2022-05-16: qty 2

## 2022-05-16 MED ORDER — FENTANYL CITRATE (PF) 100 MCG/2ML IJ SOLN: 100.0000 ug | Freq: Once | INTRAMUSCULAR | Status: AC

## 2022-05-16 MED ORDER — MIDAZOLAM HCL 2 MG/2ML IJ SOLN
INTRAMUSCULAR | Status: AC
  Filled 2022-05-16: qty 2

## 2022-05-16 MED ORDER — FENTANYL CITRATE (PF) 100 MCG/2ML IJ SOLN: 25.0000 ug | INTRAMUSCULAR | Status: DC | PRN

## 2022-05-16 MED ORDER — EPHEDRINE SULFATE-NACL 50-0.9 MG/10ML-% IV SOSY
PREFILLED_SYRINGE | INTRAVENOUS | Status: DC | PRN
  Administered 2022-05-16 (×2): 5 mg via INTRAVENOUS

## 2022-05-16 MED ORDER — CEPHALEXIN 500 MG PO CAPS
500.0000 mg | ORAL_CAPSULE | Freq: Four times a day (QID) | ORAL | 0 refills | Status: DC
  Filled 2022-05-16: qty 28, 7d supply, fill #0

## 2022-05-16 MED ORDER — AMISULPRIDE (ANTIEMETIC) 5 MG/2ML IV SOLN: 10.0000 mg | Freq: Once | INTRAVENOUS | Status: DC | PRN

## 2022-05-16 MED ORDER — ORAL CARE MOUTH RINSE: 15.0000 mL | Freq: Once | OROMUCOSAL | Status: AC

## 2022-05-16 MED ORDER — BUPIVACAINE HCL (PF) 0.5 % IJ SOLN
INTRAMUSCULAR | Status: DC | PRN
  Administered 2022-05-16: 30 mL via PERINEURAL

## 2022-05-16 MED ORDER — ONDANSETRON HCL 4 MG/2ML IJ SOLN
INTRAMUSCULAR | Status: DC | PRN
  Administered 2022-05-16: 4 mg via INTRAVENOUS

## 2022-05-16 SURGICAL SUPPLY — 51 items
ANCHOR FIBERLOCK SUSPENSION (Anchor) IMPLANT
BAG COUNTER SPONGE SURGICOUNT (BAG) ×1 IMPLANT
BNDG ELASTIC 3X5.8 VLCR STR LF (GAUZE/BANDAGES/DRESSINGS) ×1 IMPLANT
BNDG ELASTIC 4X5.8 VLCR STR LF (GAUZE/BANDAGES/DRESSINGS) IMPLANT
BNDG ESMARK 4X9 LF (GAUZE/BANDAGES/DRESSINGS) ×1 IMPLANT
BNDG GAUZE DERMACEA FLUFF 4 (GAUZE/BANDAGES/DRESSINGS) ×3 IMPLANT
CAP PIN ORTHO PINK (CAP) IMPLANT
CORD BIPOLAR FORCEPS 12FT (ELECTRODE) ×1 IMPLANT
COVER SURGICAL LIGHT HANDLE (MISCELLANEOUS) ×1 IMPLANT
CUFF TOURN SGL QUICK 18X4 (TOURNIQUET CUFF) ×1 IMPLANT
CUFF TOURN SGL QUICK 24 (TOURNIQUET CUFF)
CUFF TRNQT CYL 24X4X16.5-23 (TOURNIQUET CUFF) IMPLANT
DRAPE OEC MINIVIEW 54X84 (DRAPES) IMPLANT
DRSG ADAPTIC 3X8 NADH LF (GAUZE/BANDAGES/DRESSINGS) IMPLANT
DRSG EMULSION OIL 3X3 NADH (GAUZE/BANDAGES/DRESSINGS) ×1 IMPLANT
DRSG XEROFORM 1X8 (GAUZE/BANDAGES/DRESSINGS) IMPLANT
FIBERLOOP 2 0 (SUTURE) IMPLANT
GAUZE SPONGE 4X4 12PLY STRL (GAUZE/BANDAGES/DRESSINGS) ×1 IMPLANT
GLOVE BIOGEL M 8.0 STRL (GLOVE) ×1 IMPLANT
GLOVE SS BIOGEL STRL SZ 8 (GLOVE) ×2 IMPLANT
GOWN STRL REUS W/ TWL LRG LVL3 (GOWN DISPOSABLE) ×3 IMPLANT
GOWN STRL REUS W/ TWL XL LVL3 (GOWN DISPOSABLE) ×2 IMPLANT
GOWN STRL REUS W/TWL LRG LVL3 (GOWN DISPOSABLE) ×3
GOWN STRL REUS W/TWL XL LVL3 (GOWN DISPOSABLE) ×2
KIT ASCP FXDISP 3X8XBTNDS (KITS) IMPLANT
KIT BASIN OR (CUSTOM PROCEDURE TRAY) ×1 IMPLANT
KIT BIO-TENODESIS 3X8 DISP (KITS) ×1
KIT TURNOVER KIT B (KITS) ×1 IMPLANT
NDL HYPO 25GX1X1/2 BEV (NEEDLE) ×1 IMPLANT
NEEDLE HYPO 25GX1X1/2 BEV (NEEDLE) ×1 IMPLANT
NS IRRIG 1000ML POUR BTL (IV SOLUTION) ×1 IMPLANT
PACK ORTHO EXTREMITY (CUSTOM PROCEDURE TRAY) ×1 IMPLANT
PAD ARMBOARD 7.5X6 YLW CONV (MISCELLANEOUS) ×2 IMPLANT
PAD CAST 3X4 CTTN HI CHSV (CAST SUPPLIES) ×1 IMPLANT
PADDING CAST ABS COTTON 3X4 (CAST SUPPLIES) IMPLANT
PADDING CAST COTTON 3X4 STRL (CAST SUPPLIES) ×1
SCREW 4X10 W/DISP DRIVER (Screw) IMPLANT
SLING ARM FOAM STRAP LRG (SOFTGOODS) IMPLANT
SOL PREP POV-IOD 4OZ 10% (MISCELLANEOUS) ×2 IMPLANT
SUCTION FRAZIER HANDLE 10FR (MISCELLANEOUS)
SUCTION TUBE FRAZIER 10FR DISP (MISCELLANEOUS) IMPLANT
SUT FIBERWIRE 2-0 18 17.9 3/8 (SUTURE) ×1
SUT PROLENE 4 0 PS 2 18 (SUTURE) ×1 IMPLANT
SUT VIC AB 3-0 FS2 27 (SUTURE) IMPLANT
SUTURE FIBERWR 2-0 18 17.9 3/8 (SUTURE) IMPLANT
SYR CONTROL 10ML LL (SYRINGE) ×1 IMPLANT
TOWEL GREEN STERILE (TOWEL DISPOSABLE) ×1 IMPLANT
TOWEL GREEN STERILE FF (TOWEL DISPOSABLE) ×1 IMPLANT
TUBE CONNECTING 12X1/4 (SUCTIONS) IMPLANT
UNDERPAD 30X36 HEAVY ABSORB (UNDERPADS AND DIAPERS) ×1 IMPLANT
WATER STERILE IRR 1000ML POUR (IV SOLUTION) ×1 IMPLANT

## 2022-05-16 NOTE — Anesthesia Postprocedure Evaluation (Signed)
Anesthesia Post Note  Patient: Ashley Quinn  Procedure(s) Performed: RIGHT THUMB CARPOMETACARPEL ([place]) ARTHROPLASTY WITH DOULBE TENDON TRANSFER AND REPAIR WITH FIBER LOCK SUSPENSION AS NECESSARY (Right: Finger)     Patient location during evaluation: PACU Anesthesia Type: MAC and Regional Level of consciousness: awake and alert Pain management: pain level controlled Vital Signs Assessment: post-procedure vital signs reviewed and stable Respiratory status: spontaneous breathing and respiratory function stable Cardiovascular status: stable Postop Assessment: no apparent nausea or vomiting Anesthetic complications: no   No notable events documented.  Last Vitals:  Vitals:   05/16/22 1700 05/16/22 1715  BP: 115/77 100/78  Pulse: 85 76  Resp: 19 19  Temp: 36.4 C 36.4 C  SpO2: 95% 95%    Last Pain:  Vitals:   05/16/22 1715  TempSrc:   PainSc: 0-No pain                 Shamiah Kahler DANIEL

## 2022-05-16 NOTE — Discharge Instructions (Signed)

## 2022-05-16 NOTE — Transfer of Care (Signed)
Immediate Anesthesia Transfer of Care Note  Patient: Ashley Quinn  Procedure(s) Performed: RIGHT THUMB CARPOMETACARPEL ([place]) ARTHROPLASTY WITH DOULBE TENDON TRANSFER AND REPAIR WITH FIBER LOCK SUSPENSION AS NECESSARY (Right: Finger)  Patient Location: PACU  Anesthesia Type:MAC and Regional  Level of Consciousness: awake, alert , and oriented  Airway & Oxygen Therapy: Patient Spontanous Breathing  Post-op Assessment: Report given to RN  Post vital signs: Reviewed and stable  Last Vitals:  Vitals Value Taken Time  BP 115/77 05/16/22 1701  Temp    Pulse 83   Resp 19 05/16/22 1702  SpO2 95   Vitals shown include unvalidated device data.  Last Pain:  Vitals:   05/16/22 1330  TempSrc:   PainSc: 0-No pain         Complications: No notable events documented.

## 2022-05-16 NOTE — Op Note (Signed)
Operative note May 16, 2022  Roseanne Kaufman, MD.  Date of dictation and operation May 16, 2022.  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  Operative procedure #1 right thumb CMC arthroplasty (removal of the trapezium at the basilar thumb/wrist region/carpectomy trapezium).  #2 abductor pollicis longus one third proper portion tendon transfer to the first metacarpal FCR and back upon itself with Bio-Tenodesis screw fixation (Zancolli tendon transfer).  #3 abductor pollicis longus digastric portion tendon transfer to the FCR APL proper and back upon themselves with multiple figure-of-eight throws (Weilby tendon transfer at the wrist level).  #4 APL tenodesis/shortening of her wrist extensor at the wrist/forearm level to prevent dorsal lateral escape.  #5 fiber lock suspension from Arthrex right time #6 FCR tenotomy at the middle proximal third of the forearm #7 stress radiography 5 view right thumb and wrist  Anesthesia Block with IV sedation  Surgeon Roseanne Kaufman.  Tourniquet time less than 90 minutes.  Estimated blood loss minimal.  Specimens none.  Description of procedure: Patient was taken to the operative theater after counseling she was prepped with Hibiclens scrub followed by Betadine scrub and paint.  SCDs were placed IV sedation was given and timeout was observed.  Following sterile field we then commenced with tourniquet insufflation to 250 mmHg about the right upper extremity.  Dissection was carried down with knife blade and interval between the EPB and APL was created.  The patient had the first dorsal compartment released under 4.5 loupe magnification.  Identified superficial radial nerve and its branches as well as the arterial structures about the wrist and swept these all out of harm's way.  I accessed the trapezium and then excised the trapezium piecemeal.  FCR  tenolysis tendon synovectomy was accomplished in the depths of the wound.  I then made a drill hole with a 3.0 drill bit dorsal to palmar exiting intra-articularly in line with the palmar beak ligament.  Patient tolerated this well.  This completed the arthroplasty portion of the procedure.  Following this we then placed a fiber lock suspension system.  Guidewire was placed in second metacarpal base and exited distal ulna I then placed the guide for the drill drilled after removing the K wire and placed a fiber lock.  The fiber tapes were then threaded from the second metacarpal base through the drill hole palm and dorsal.  Tension was held and checked this looked excellent.  This time I harvested the APL digastric portion and a one third proper portion of the APL.  These were harvested through a small counterincision in the mid portion of the forearm distally and was carefully protected and sutured closed after the harvest.  Fiber loops were placed around the tendon edges.  At this time I performed a APL one third proper portion tendon transfer to the first metacarpal dorsal to palmar I then placed this around the FCR twice secured this and then weaved through the area about the drill hole.  I sent tension on the fiber tapes and placed a 4 x 10 mm Bio-Tenodesis screw.  This achieved the Zancolli tendon transfer and excellent suspension with the fiber lock.  I was quite pleased with this.  Following this a second tendon transfer was accomplished.  The APL digastric portion was placed around the FCR and back to the APL proper with multiple figure-of-eight throws then inset/secured with FiberWire.  This completed the [facility] tendon transfer.  I was  quite pleased this.  I then performed a APL tenodesis this was a shortening of her wrist extensor at the wrist forearm level to prevent dorsal lateral escape.  The patient tolerated this well.  This was performed with FiberWire suture.  I irrigated copiously and  then closed the capsule.  The radial artery and branches of the superficial radial nerve looked excellent.  I then performed a counterincision in the mid to proximal portion of the forearm and performed a musculotendinous tenotomy of the FCR to lessen deforming forces distally.  This was done without difficulty and was an FCR tenotomy.  5 view radiographic series was performed examined and interpreted by myself and looked excellent.  I was quite pleased with this and the findings.  Following this wounds were closed with Prolene.  Patient tolerated this well.  There were no complicating features.  All sponge needle and instrument counts were reported as correct.  Patient will notify me should any problems occur.  They have my cell phone number for any issues.  Discharge medicines have been discussed.  Rest elevation movement of the fingers and massage have been instructed of course.  Will proceed according to our standard postop algorithm.  She was taken to recovery room in excellent condition.  Liron Eissler MD

## 2022-05-16 NOTE — Anesthesia Procedure Notes (Signed)
Anesthesia Regional Block: Supraclavicular block   Pre-Anesthetic Checklist: , timeout performed,  Correct Patient, Correct Site, Correct Laterality,  Correct Procedure, Correct Position, site marked,  Risks and benefits discussed,  Surgical consent,  Pre-op evaluation,  At surgeon's request and post-op pain management  Laterality: Right  Prep: chloraprep       Needles:  Injection technique: Single-shot  Needle Type: Echogenic Stimulator Needle     Needle Length: 5cm  Needle Gauge: 22     Additional Needles:   Narrative:  Start time: 05/16/2022 3:06 PM End time: 05/16/2022 3:16 PM Injection made incrementally with aspirations every 5 mL.  Performed by: Personally  Anesthesiologist: Duane Boston, MD  Additional Notes: Functioning IV was confirmed and monitors applied.  A 60m 22ga echogenic arrow stimulator was used. Sterile prep and drape,hand hygiene and sterile gloves were used.Ultrasound guidance: relevant anatomy identified, needle position confirmed, local anesthetic spread visualized around nerve(s)., vascular puncture avoided.  Image printed for medical record.  Negative aspiration and negative test dose prior to incremental administration of local anesthetic. The patient tolerated the procedure well.

## 2022-05-16 NOTE — Anesthesia Preprocedure Evaluation (Addendum)
Anesthesia Evaluation  Patient identified by MRN, date of birth, ID band Patient awake    Reviewed: Allergy & Precautions, NPO status , Patient's Chart, lab work & pertinent test results  History of Anesthesia Complications (+) PONV  Airway Mallampati: II  TM Distance: >3 FB Neck ROM: Full    Dental no notable dental hx. (+) Dental Advisory Given   Pulmonary neg pulmonary ROS   Pulmonary exam normal        Cardiovascular negative cardio ROS Normal cardiovascular exam     Neuro/Psych negative neurological ROS     GI/Hepatic Neg liver ROS,GERD  ,,  Endo/Other  negative endocrine ROS    Renal/GU negative Renal ROS     Musculoskeletal negative musculoskeletal ROS (+)    Abdominal   Peds  Hematology negative hematology ROS (+)   Anesthesia Other Findings   Reproductive/Obstetrics                             Anesthesia Physical Anesthesia Plan  ASA: 2  Anesthesia Plan: MAC and Regional   Post-op Pain Management: Regional block* and Tylenol PO (pre-op)*   Induction:   PONV Risk Score and Plan: 3 and Ondansetron, Midazolam and Propofol infusion  Airway Management Planned: Simple Face Mask  Additional Equipment:   Intra-op Plan:   Post-operative Plan:   Informed Consent: I have reviewed the patients History and Physical, chart, labs and discussed the procedure including the risks, benefits and alternatives for the proposed anesthesia with the patient or authorized representative who has indicated his/her understanding and acceptance.     Dental advisory given  Plan Discussed with: Anesthesiologist and CRNA  Anesthesia Plan Comments:        Anesthesia Quick Evaluation

## 2022-05-16 NOTE — H&P (Signed)
Ashley Quinn Stephanie Acre is an 52 y.o. female.   Chief Complaint: Right thumb basilar thumb joint arthritis HPI: Patient presents for right thumb [facility] arthroplasty with repair reconstruction is necessary.  Patient presents for evaluation and treatment of the of their upper extremity predicament. The patient denies neck, back, chest or  abdominal pain. The patient notes that they have no lower extremity problems. The patients primary complaint is noted. We are planning surgical care pathway for the upper extremity.   Past Medical History:  Diagnosis Date  . Abnormal Pap smear of cervix   . Anemia 2020  . COVID 08/2020  . GERD (gastroesophageal reflux disease)    history of  . HSV-1 (herpes simplex virus 1) infection   . Melanoma ([place]) 11/2012   left thigh  . Migraine with visual aura   . PONV (postoperative nausea and vomiting)     Past Surgical History:  Procedure Laterality Date  . COLPOSCOPY  05/2010   Mild Dysplasia  . DILITATION & CURRETTAGE/HYSTROSCOPY WITH NOVASURE ABLATION N/A 11/28/2016   Procedure: DILATATION & CURETTAGE/HYSTEROSCOPY WITH ROLLER BALL ABLATION W/BIPOLAR CAUTERY AND ATTEMPTED NOVASURE ABLATION;  Surgeon: Nunzio Cobbs, MD;  Location: [facility];  Service: Gynecology;  Laterality: N/A;  . ENDOSCOPIC VEIN LASER TREATMENT Right 07/25/2016  . melanoma removal  11/2012   -left thigh  . TENDON TRANSFER Left 01/13/2020  . WRIST FRACTURE SURGERY Left     Family History  Problem Relation Age of Onset  . Hypertension Mother   . Osteoporosis Mother   . Breast cancer Mother        postmenopausal, age 42  . Hyperlipidemia Father   . Prostate cancer Father   . Cancer Father        bladder ca  . Heart disease Paternal Grandmother    Social History:  reports that she has never smoked. She has never used smokeless tobacco. She reports current alcohol use of about 3.0 - 4.0 standard drinks of alcohol per week. She reports that she does not use  drugs.  Allergies:  Allergies  Allergen Reactions  . Codeine Nausea Only    Medications Prior to Admission  Medication Sig Dispense Refill  . ASHWAGANDHA PO Take 1 capsule by mouth daily.    . B Complex-C (B-COMPLEX WITH VITAMIN C) tablet Take 1 tablet by mouth daily.    . cetirizine (ZYRTEC) 10 MG tablet Take 10 mg by mouth daily as needed for allergies.     . Cholecalciferol (VITAMIN D) 125 MCG (5000 UT) CAPS Take 5,000 Units by mouth daily.    . cycloSPORINE (RESTASIS) 0.05 % ophthalmic emulsion Place 1 drop into both eyes every 12 (twelve) hours. 180 each 4  . magnesium 30 MG tablet Take 30 mg by mouth at bedtime.    . Nutritional Supplements (ESTROVEN PO) Take 1 tablet by mouth daily.    . Omega-3 Fatty Acids (FISH OIL) 1000 MG CAPS Take 1,000 mg by mouth daily.    Marland Kitchen OVER THE COUNTER MEDICATION Take 1 capsule by mouth daily. Heme-B12 Folate    . TURMERIC PO Take 1 capsule by mouth daily.    . valACYclovir (VALTREX) 1000 MG tablet Take 2 tablets by mouth now, repeat in 12 hours as needed for an outbreak. 20 tablet 1  . ibuprofen (ADVIL) 800 MG tablet Take 1 tablet (800 mg total) by mouth every 8 (eight) hours as needed. (Patient not taking: Reported on 05/12/2022) 30 tablet 0  . norethindrone (ORTHO MICRONOR)  0.35 MG tablet Take 1 tablet (0.35 mg total) by mouth daily. (Patient not taking: Reported on 01/27/2022) 84 tablet 3  . tapentadol (NUCYNTA) 50 MG tablet Take 1-2 tablets (50-100 mg total) by mouth every 4 to 6 hours as needed for pain. No more than 6 tablets a day 30 tablet 0    Results for orders placed or performed during the hospital encounter of 05/16/22 (from the past 48 hour(s))  CBC per protocol     Status: Abnormal   Collection Time: 05/16/22  1:30 PM  Result Value Ref Range   WBC 5.6 4.0 - 10.5 K/uL   RBC 5.42 (H) 3.87 - 5.11 MIL/uL   Hemoglobin 14.0 12.0 - 15.0 g/dL   HCT 43.6 36.0 - 46.0 %   MCV 80.4 80.0 - 100.0 fL   MCH 25.8 (L) 26.0 - 34.0 pg   MCHC 32.1  30.0 - 36.0 g/dL   RDW 16.8 (H) 11.5 - 15.5 %   Platelets 199 150 - 400 K/uL   nRBC 0.0 0.0 - 0.2 %    Comment: Performed at [facility], [place] [address] [place], [place] [address]  Pregnancy, urine POC     Status: None   Collection Time: 05/16/22  1:57 PM  Result Value Ref Range   Preg Test, Ur NEGATIVE NEGATIVE    Comment:        THE SENSITIVITY OF THIS METHODOLOGY IS >24 mIU/mL    DG MINI C-ARM IMAGE ONLY  Result Date: 05/16/2022 There is no interpretation for this exam.  This order is for images obtained during a surgical procedure.  Please See "Surgeries" Tab for more information regarding the procedure.    Review of Systems  Respiratory: Negative.    Cardiovascular: Negative.   Gastrointestinal: Negative.   Genitourinary: Negative.    Blood pressure (!) 117/93, pulse 82, temperature 97.8 F (36.6 C), temperature source Oral, resp. rate 18, height '5\' 10"'$  (1.778 m), weight 71.7 kg, SpO2 98 %. Physical Exam  Right thumb basilar arthritis with pain no MCP hyperextension no signs of dystrophy or infection.  Will plan for surgical reconstruction.  All questions have been encouraged and answered preoperatively  The patient is alert and oriented in no acute distress. The patient complains of pain in the affected upper extremity.  The patient is noted to have a normal HEENT exam. Lung fields show equal chest expansion and no shortness of breath. Abdomen exam is nontender without distention. Lower extremity examination does not show any fracture dislocation or blood clot symptoms. Pelvis is stable and the neck and back are stable and nontender.  Assessment/Plan Will plan for right thumb CMC arthroplasty with suspension and double tendon transfer as outlined  We are planning surgery for your upper extremity. The risk and benefits of surgery to include risk of bleeding, infection, anesthesia,  damage to normal structures and failure of the surgery to accomplish its intended  goals of relieving symptoms and restoring function have been discussed in detail. With this in mind we plan to proceed. I have specifically discussed with the patient the pre-and postoperative regime and the dos and don'ts and risk and benefits in great detail. Risk and benefits of surgery also include risk of dystrophy(CRPS), chronic nerve pain, failure of the healing process to go onto completion and other inherent risks of surgery The relavent the pathophysiology of the disease/injury process, as well as the alternatives for treatment and postoperative course of action has been discussed in great detail  with the patient who desires to proceed.  We will do everything in our power to help you (the patient) restore function to the upper extremity. It is a pleasure to see this patient today.   Willa Frater III, MD 05/16/2022, 3:01 PM

## 2022-05-18 ENCOUNTER — Encounter (HOSPITAL_COMMUNITY): Payer: Self-pay | Admitting: Orthopedic Surgery

## 2022-05-29 DIAGNOSIS — M79644 Pain in right finger(s): Secondary | ICD-10-CM | POA: Diagnosis not present

## 2022-05-29 DIAGNOSIS — M1811 Unilateral primary osteoarthritis of first carpometacarpal joint, right hand: Secondary | ICD-10-CM | POA: Diagnosis not present

## 2022-05-29 DIAGNOSIS — Z4789 Encounter for other orthopedic aftercare: Secondary | ICD-10-CM | POA: Diagnosis not present

## 2022-05-29 DIAGNOSIS — M1812 Unilateral primary osteoarthritis of first carpometacarpal joint, left hand: Secondary | ICD-10-CM | POA: Diagnosis not present

## 2022-06-08 ENCOUNTER — Other Ambulatory Visit (HOSPITAL_COMMUNITY): Payer: Self-pay

## 2022-06-09 ENCOUNTER — Other Ambulatory Visit (HOSPITAL_BASED_OUTPATIENT_CLINIC_OR_DEPARTMENT_OTHER): Payer: Self-pay

## 2022-06-09 ENCOUNTER — Other Ambulatory Visit: Payer: Self-pay

## 2022-06-12 DIAGNOSIS — Z4789 Encounter for other orthopedic aftercare: Secondary | ICD-10-CM | POA: Diagnosis not present

## 2022-06-12 DIAGNOSIS — M1812 Unilateral primary osteoarthritis of first carpometacarpal joint, left hand: Secondary | ICD-10-CM | POA: Diagnosis not present

## 2022-06-12 DIAGNOSIS — M1811 Unilateral primary osteoarthritis of first carpometacarpal joint, right hand: Secondary | ICD-10-CM | POA: Diagnosis not present

## 2022-06-12 DIAGNOSIS — M79644 Pain in right finger(s): Secondary | ICD-10-CM | POA: Diagnosis not present

## 2022-06-19 DIAGNOSIS — M79644 Pain in right finger(s): Secondary | ICD-10-CM | POA: Diagnosis not present

## 2022-06-26 DIAGNOSIS — M79644 Pain in right finger(s): Secondary | ICD-10-CM | POA: Diagnosis not present

## 2022-07-04 DIAGNOSIS — M79644 Pain in right finger(s): Secondary | ICD-10-CM | POA: Diagnosis not present

## 2022-07-17 DIAGNOSIS — M79644 Pain in right finger(s): Secondary | ICD-10-CM | POA: Diagnosis not present

## 2022-08-03 DIAGNOSIS — M79644 Pain in right finger(s): Secondary | ICD-10-CM | POA: Diagnosis not present

## 2022-08-21 ENCOUNTER — Other Ambulatory Visit (HOSPITAL_BASED_OUTPATIENT_CLINIC_OR_DEPARTMENT_OTHER): Payer: Self-pay

## 2022-08-21 ENCOUNTER — Encounter: Payer: Self-pay | Admitting: Family Medicine

## 2022-08-21 ENCOUNTER — Ambulatory Visit (INDEPENDENT_AMBULATORY_CARE_PROVIDER_SITE_OTHER): Payer: 59 | Admitting: Family Medicine

## 2022-08-21 VITALS — BP 110/70 | HR 90 | Temp 98.0°F | Resp 19 | Ht 70.0 in | Wt 163.1 lb

## 2022-08-21 DIAGNOSIS — F419 Anxiety disorder, unspecified: Secondary | ICD-10-CM

## 2022-08-21 MED ORDER — FLUOXETINE HCL 10 MG PO CAPS
10.0000 mg | ORAL_CAPSULE | Freq: Every day | ORAL | 3 refills | Status: DC
  Filled 2022-08-21: qty 30, 30d supply, fill #0
  Filled 2022-09-13 – 2022-09-14 (×2): qty 30, 30d supply, fill #1

## 2022-08-21 NOTE — Progress Notes (Signed)
   Subjective:    Patient ID: Ashley Quinn, female    DOB: [birth year], 52 y.o.   MRN: [id number]  HPI Anxiety- 'i am sick of trying to deal w/ anxiety w/o any help'.  Son has moved out and to , daughter has moved home from college, friends daughter was involved in shooting in [place].  'it's just too much'.  Has had anxiety most of her life but has never been treated.  Unable to sleep.  Feels exhausted 'all the time'.  Tearful.  'i feel out of control in my head- overwhelmed'   Review of Systems For ROS see HPI     Objective:   Physical Exam Vitals reviewed.  Constitutional:      General: She is not in acute distress.    Appearance: Normal appearance. She is not ill-appearing.  HENT:     Head: Normocephalic and atraumatic.  Cardiovascular:     Rate and Rhythm: Normal rate.  Pulmonary:     Effort: Pulmonary effort is normal. No respiratory distress.  Skin:    General: Skin is warm and dry.  Neurological:     General: No focal deficit present.     Mental Status: She is alert and oriented to person, place, and time.  Psychiatric:        Mood and Affect: Mood normal.        Behavior: Behavior normal.        Thought Content: Thought content normal.           Assessment & Plan:

## 2022-08-21 NOTE — Assessment & Plan Note (Signed)
New to provider.  Pt has been dealing w/ this for more of her life and has decided she doesn't want to struggle anymore.  Feeling overwhelmed at home and work.  Anxiety is impacting sleep.  She has taken Alprazolam in the past when her dad had a severe MVA but nothing since.  Will start low dose Fluoxetine and monitor for symptom improvement.  Reviewed possible side effects, appropriate use, and timeline to feeling better.  Pt expressed understanding and is in agreement w/ plan.

## 2022-08-21 NOTE — Patient Instructions (Signed)
Follow up in 1 month to recheck mood START the Fluoxetine once daily If you have side effects that persist longer than 7 days- let me know Keep up the good work with your sleep hygiene and relaxation techniques Call with any questions or concerns Hang in there!  We'll get this better!!!

## 2022-08-22 IMAGING — MG MM DIGITAL DIAGNOSTIC UNILAT*L* W/ TOMO W/ CAD
6 series · 6 of 18 positions shown · non-contrast
Comparison: Previous exam(s).

CLINICAL DATA: Screening recall for a possible left breast
architectural distortion.

EXAM:
DIGITAL DIAGNOSTIC UNILATERAL LEFT MAMMOGRAM WITH TOMOSYNTHESIS AND
CAD
TECHNIQUE: Left digital diagnostic mammography and breast tomosynthesis was
performed. The images were evaluated with computer-aided detection.

[L MLO synth-2D]
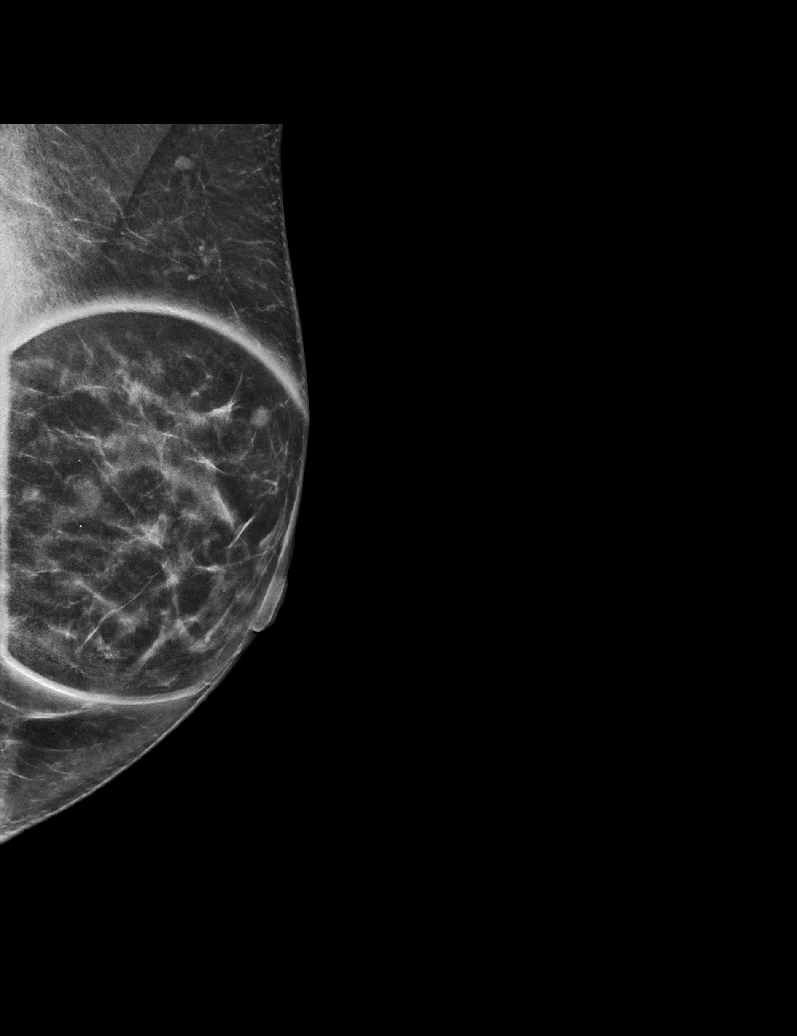

[L ML synth-2D]
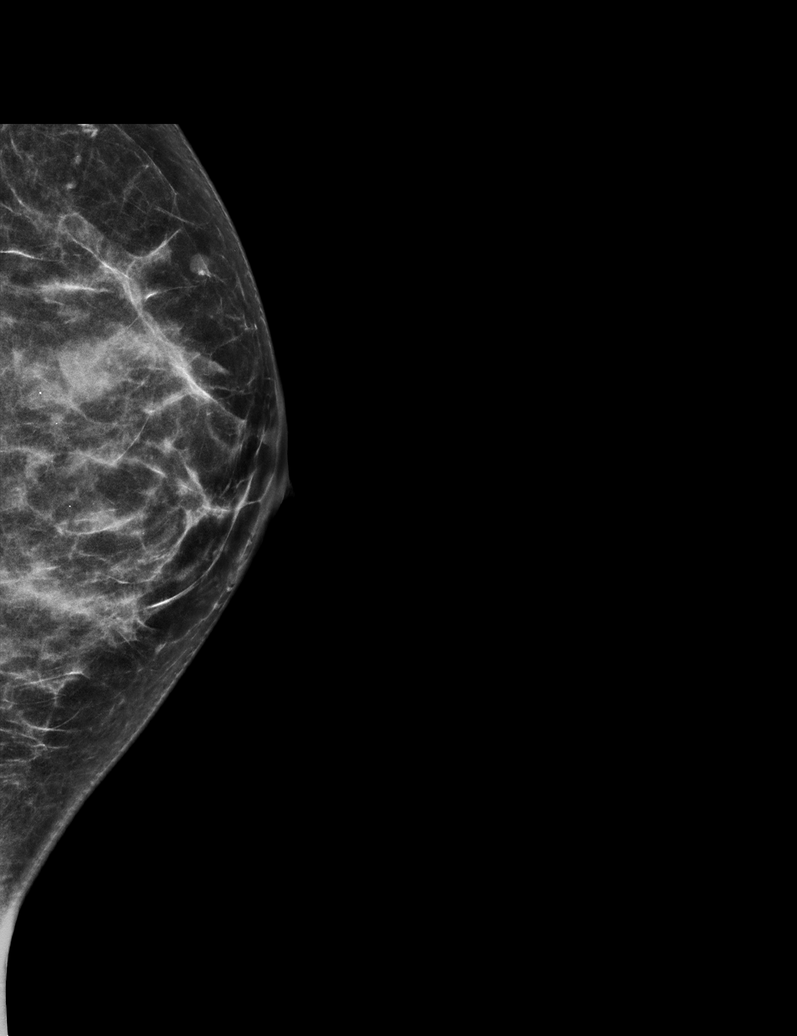

[L CC synth-2D]
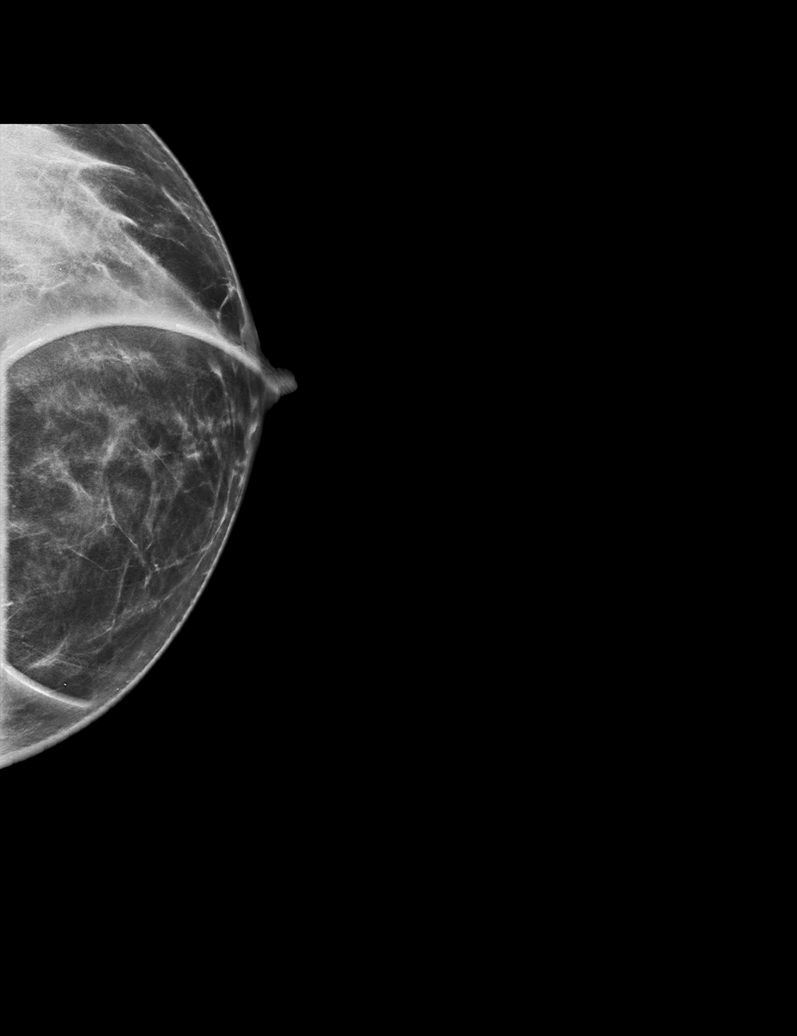

[L CC tomo · tomo slice 29/58.0]
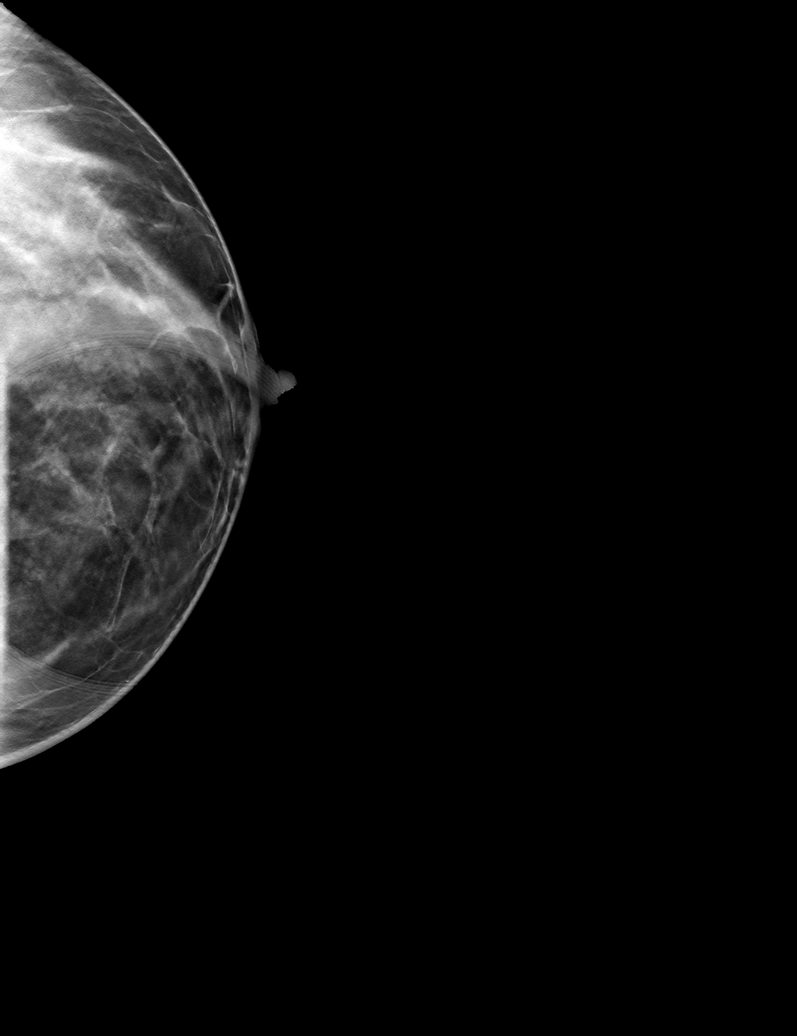

[L ML tomo · tomo slice 31/61.0]
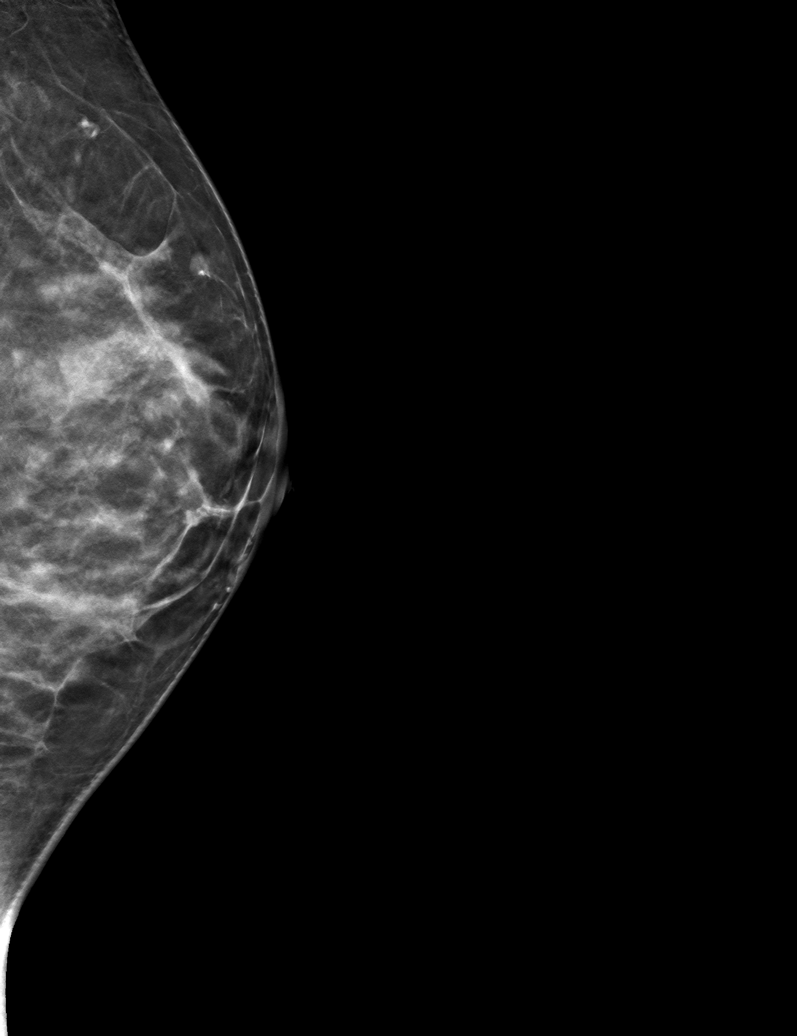

[L MLO tomo · tomo slice 33/65.0]
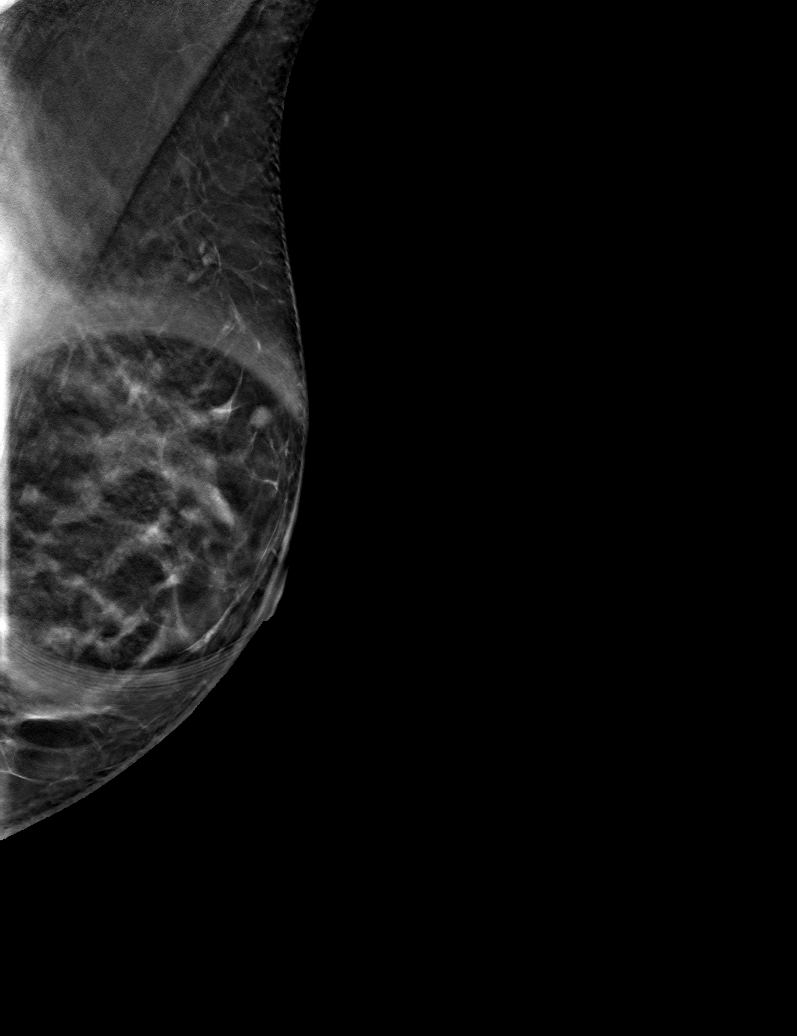

[6 of 18 positions shown; findings below may reference images not displayed]

ACR Breast Density Category d: The breast tissue is extremely dense,
which lowers the sensitivity of mammography.
FINDINGS: On the diagnostic spot-compression images, the possible
architectural distortion and left breast disperses consistent with
superimposed fibroglandular tissue. There is no underlying mass,
significant asymmetry or true architectural distortion.
IMPRESSION: No evidence of breast malignancy.

RECOMMENDATION:
Screening mammogram in one year.(Code:5J-0-ATE)

I have discussed the findings and recommendations with the patient.
If applicable, a reminder letter will be sent to the patient
regarding the next appointment.

BI-RADS CATEGORY  1: Negative.

## 2022-09-06 ENCOUNTER — Encounter: Payer: Self-pay | Admitting: Family Medicine

## 2022-09-13 ENCOUNTER — Other Ambulatory Visit (HOSPITAL_BASED_OUTPATIENT_CLINIC_OR_DEPARTMENT_OTHER): Payer: Self-pay

## 2022-09-18 ENCOUNTER — Ambulatory Visit (INDEPENDENT_AMBULATORY_CARE_PROVIDER_SITE_OTHER): Payer: 59 | Admitting: Family Medicine

## 2022-09-18 ENCOUNTER — Other Ambulatory Visit (HOSPITAL_BASED_OUTPATIENT_CLINIC_OR_DEPARTMENT_OTHER): Payer: Self-pay

## 2022-09-18 VITALS — BP 120/76 | HR 83 | Temp 98.1°F | Resp 17 | Ht 70.0 in | Wt 163.5 lb

## 2022-09-18 DIAGNOSIS — F419 Anxiety disorder, unspecified: Secondary | ICD-10-CM

## 2022-09-18 MED ORDER — FLUOXETINE HCL 20 MG PO CAPS
20.0000 mg | ORAL_CAPSULE | Freq: Every day | ORAL | 3 refills | Status: DC
  Filled 2022-09-18: qty 30, 30d supply, fill #0

## 2022-09-18 NOTE — Progress Notes (Signed)
   Subjective:    Patient ID: Ashley Quinn, female    DOB: 1970/04/18, 52 y.o.   MRN: [id number]  HPI Anxiety- at last visit was started on Fluoxetine 10mg  daily.  Pt feels that things are improving but that things 'could be better'.  Did have some GI issues at onset but this could have been coincidental.   Review of Systems For ROS see HPI     Objective:   Physical Exam Vitals reviewed.  Constitutional:      General: She is not in acute distress.    Appearance: Normal appearance. She is not ill-appearing.  HENT:     Head: Normocephalic and atraumatic.  Skin:    General: Skin is warm and dry.  Neurological:     General: No focal deficit present.     Mental Status: She is alert and oriented to person, place, and time.  Psychiatric:        Mood and Affect: Mood normal.        Behavior: Behavior normal.        Thought Content: Thought content normal.           Assessment & Plan:

## 2022-09-18 NOTE — Assessment & Plan Note (Signed)
Some improvement since starting Fluoxetine 10mg  daily but she feels that things could be even better.  Will increase to 20mg  daily.  Pt expressed understanding and is in agreement w/ plan.

## 2022-09-18 NOTE — Patient Instructions (Signed)
Follow up as needed or as scheduled INCREASE the Fluoxetine to 20mg  daily- 2 of what you have at home, and 1 of the new prescription Make sure you are taking time to put yourself first Call with any questions or concerns Stay Safe!  Stay Healthy! Have a great summer!!

## 2022-10-17 NOTE — Progress Notes (Signed)
52 y.o. G32P2002 Married Caucasian female here for annual exam.  Pt reported weight gain, bleeding, and low energy.  Bleeding heavy for 2 weeks.  Changing super or super plus protection every hour.    Spotting in Dec (12/26 - 1/20), Jan (1/25, 1/30 with migraine), Feb, and March.  No cycle in April.  Light period in May.  Light period in June.  Bleeding started July 2 and continues.   She did not have controlled bleeding on Micronor.   8 pound weight gain over the last year.   Taking iron and is followed by PCP.  Has been taking iron twice daily.  Declines blood work today.  PCP:   Dr. Beverely Low  Patient's last menstrual period was 10/10/2022.     Period Duration (Days): 10-14 Period Pattern: (!) Irregular Menstrual Flow: Moderate, Heavy     Sexually active: Yes.    The current method of family planning is vasectomy.    Exercising: Yes.     Cardio and weights, yoga Smoker:  no  Health Maintenance: Pap:  09/18/18 neg: HR HPV neg, 06/30/14 neg History of abnormal Pap:  yes MMG:  05/04/22 Breast Density Cat C, BI-RADS CAT 1 neg Colonoscopy:  cologuard 11/11/20 BMD:   n/a  Result  n/a TDaP:  05/23/21 Gardasil:   no HIV: 11/30/21 NR Hep C: 11/30/21 NR Screening Labs:   PCP.  Declines today.    reports that she has never smoked. She has never used smokeless tobacco. She reports current alcohol use of about 3.0 - 4.0 standard drinks of alcohol per week. She reports that she does not use drugs.  Past Medical History:  Diagnosis Date   Abnormal Pap smear of cervix    Anemia 2020   COVID 08/2020   GERD (gastroesophageal reflux disease)    history of   HSV-1 (herpes simplex virus 1) infection    Melanoma ([facility]) 11/2012   left thigh   Migraine with visual aura    PONV (postoperative nausea and vomiting)     Past Surgical History:  Procedure Laterality Date   COLPOSCOPY  05/2010   Mild Dysplasia   DILITATION & CURRETTAGE/HYSTROSCOPY WITH NOVASURE ABLATION N/A 11/28/2016    Procedure: DILATATION & CURETTAGE/HYSTEROSCOPY WITH ROLLER BALL ABLATION W/BIPOLAR CAUTERY AND ATTEMPTED NOVASURE ABLATION;  Surgeon: Patton Salles, MD;  Location: [facility];  Service: Gynecology;  Laterality: N/A;   ENDOSCOPIC VEIN LASER TREATMENT Right 07/25/2016   FINGER ARTHROSCOPY WITH CARPOMETACARPEL [facility]) ARTHROPLASTY Right 05/16/2022   Procedure: RIGHT THUMB CARPOMETACARPEL (CMC) ARTHROPLASTY WITH DOULBE TENDON TRANSFER AND REPAIR WITH FIBER LOCK SUSPENSION AS NECESSARY;  Surgeon: Dominica Severin, MD;  Location: [facility];  Service: Orthopedics;  Laterality: Right;  REGIONAL WITH IV SEDATION   melanoma removal  11/2012   -left thigh   TENDON TRANSFER Left 01/13/2020   WRIST FRACTURE SURGERY Left     Current Outpatient Medications  Medication Sig Dispense Refill   ASHWAGANDHA PO Take 1 capsule by mouth daily.     B Complex-C (B-COMPLEX WITH VITAMIN C) tablet Take 1 tablet by mouth daily.     cetirizine (ZYRTEC) 10 MG tablet Take 10 mg by mouth daily as needed for allergies.      Cholecalciferol (VITAMIN D) 125 MCG (5000 UT) CAPS Take 5,000 Units by mouth daily.     cycloSPORINE (RESTASIS) 0.05 % ophthalmic emulsion Place 1 drop into both eyes every 12 (twelve) hours. 180 each 4   FLUoxetine (PROZAC) 20 MG capsule Take 1 capsule (  20 mg total) by mouth daily. 30 capsule 3   ibuprofen (ADVIL) 800 MG tablet Take 1 tablet (800 mg total) by mouth every 8 (eight) hours as needed. 30 tablet 0   Iron Combinations (IRON COMPLEX PO) Take 20 mg of iron by mouth daily.     magnesium 30 MG tablet Take 30 mg by mouth at bedtime.     Nutritional Supplements (ESTROVEN PO) Take 1 tablet by mouth daily.     Omega-3 Fatty Acids (FISH OIL) 1000 MG CAPS Take 1,000 mg by mouth daily.     TURMERIC PO Take 1 capsule by mouth daily.     valACYclovir (VALTREX) 1000 MG tablet Take 2 tablets by mouth now, repeat in 12 hours as needed for an outbreak. 20 tablet 1   No current facility-administered medications  for this visit.    Family History  Problem Relation Age of Onset   Hypertension Mother    Osteoporosis Mother    Breast cancer Mother        postmenopausal, age 72   Hyperlipidemia Father    Prostate cancer Father    Cancer Father        bladder ca   Heart disease Paternal Grandmother     Review of Systems  All other systems reviewed and are negative.   Exam:   BP 120/76 (BP Location: Left Arm, Patient Position: Sitting, Cuff Size: Normal)   Ht 5\' 10"  (1.778 m)   Wt 164 lb (74.4 kg)   LMP 10/10/2022   BMI 23.53 kg/m     General appearance: alert, cooperative and appears stated age Head: normocephalic, without obvious abnormality, atraumatic Neck: no adenopathy, supple, symmetrical, trachea midline and thyroid normal to inspection and palpation Lungs: clear to auscultation bilaterally Breasts: normal appearance, no masses or tenderness, No nipple retraction or dimpling, No nipple discharge or bleeding, No axillary adenopathy Heart: regular rate and rhythm Abdomen: soft, non-tender; no masses, no organomegaly Extremities: extremities normal, atraumatic, no cyanosis or edema Skin: skin color, texture, turgor normal. No rashes or lesions Lymph nodes: cervical, supraclavicular, and axillary nodes normal. Neurologic: grossly normal  Pelvic: External genitalia:  no lesions              No abnormal inguinal nodes palpated.              Urethra:  normal appearing urethra with no masses, tenderness or lesions              Bartholins and Skenes: normal                 Vagina: normal appearing vagina with normal color and discharge, no lesions              Cervix: no lesions.  Blood from os.               Pap taken: no Bimanual Exam:  Uterus:  normal size, contour, position, consistency, mobility, non-tender              Adnexa: no mass, fullness, tenderness              Rectal exam: yes.  Confirms.              Anus:  normal sphincter tone, no lesions  Chaperone was present  for exam:  Warren Lacy, CMA  Assessment:   Well woman visit with gynecologic exam. Cat D density of the breast.  Hx prior LGSIL in 2012.  Status post endometrial ablation.  Attempted Novasure and then conversion to roller ball. Menorrhagia with prolonged menstruation.  I suspect anovulatory bleeding.  HSV 1.  FH breast cancer in mother.  Varicose veins.  Visual migraines.  Plan: Mammogram screening discussed. Self breast awareness reviewed. Pap and HR HPV 2025.  Guidelines for Calcium, Vitamin D, regular exercise program including cardiovascular and weight bearing exercise. She declines UPT today. Course of Provera 10 mg x 10 days per month to regulate cycles as needed.   Side effects reviewed.  Will do pelvic US if irregular or heavy bleeding persists. Refill of Valtrex.  Brochure on menopause. Follow up annually and prn.   After visit summary provided.

## 2022-10-24 ENCOUNTER — Encounter: Payer: Self-pay | Admitting: Obstetrics and Gynecology

## 2022-10-26 ENCOUNTER — Ambulatory Visit (INDEPENDENT_AMBULATORY_CARE_PROVIDER_SITE_OTHER): Payer: 59 | Admitting: Obstetrics and Gynecology

## 2022-10-26 ENCOUNTER — Other Ambulatory Visit (HOSPITAL_BASED_OUTPATIENT_CLINIC_OR_DEPARTMENT_OTHER): Payer: Self-pay

## 2022-10-26 ENCOUNTER — Encounter: Payer: Self-pay | Admitting: Obstetrics and Gynecology

## 2022-10-26 VITALS — BP 120/76 | Ht 70.0 in | Wt 164.0 lb

## 2022-10-26 DIAGNOSIS — Z01419 Encounter for gynecological examination (general) (routine) without abnormal findings: Secondary | ICD-10-CM | POA: Diagnosis not present

## 2022-10-26 MED ORDER — VALACYCLOVIR HCL 1 G PO TABS
ORAL_TABLET | ORAL | 1 refills | Status: DC
  Filled 2022-10-26: qty 20, 10d supply, fill #0

## 2022-10-26 MED ORDER — MEDROXYPROGESTERONE ACETATE 10 MG PO TABS
10.0000 mg | ORAL_TABLET | Freq: Every day | ORAL | 5 refills | Status: DC
  Filled 2022-10-26: qty 10, 10d supply, fill #0
  Filled 2022-11-10: qty 10, 10d supply, fill #1
  Filled 2023-01-10 (×2): qty 10, 10d supply, fill #2

## 2022-10-26 NOTE — Patient Instructions (Signed)

## 2022-10-31 ENCOUNTER — Encounter: Payer: Self-pay | Admitting: Family Medicine

## 2022-10-31 ENCOUNTER — Other Ambulatory Visit (HOSPITAL_BASED_OUTPATIENT_CLINIC_OR_DEPARTMENT_OTHER): Payer: Self-pay

## 2022-10-31 MED ORDER — FLUOXETINE HCL 10 MG PO TABS
10.0000 mg | ORAL_TABLET | Freq: Every day | ORAL | 1 refills | Status: DC
  Filled 2022-10-31: qty 90, 90d supply, fill #0

## 2022-11-08 ENCOUNTER — Encounter (INDEPENDENT_AMBULATORY_CARE_PROVIDER_SITE_OTHER): Payer: Self-pay

## 2022-11-21 ENCOUNTER — Encounter: Payer: Self-pay | Admitting: Family Medicine

## 2022-11-21 DIAGNOSIS — E559 Vitamin D deficiency, unspecified: Secondary | ICD-10-CM

## 2022-11-21 DIAGNOSIS — N921 Excessive and frequent menstruation with irregular cycle: Secondary | ICD-10-CM

## 2022-11-21 DIAGNOSIS — Z0000 Encounter for general adult medical examination without abnormal findings: Secondary | ICD-10-CM

## 2022-11-21 NOTE — Addendum Note (Signed)
Addended by: Sheliah Hatch on: 11/21/2022 08:25 AM   Modules accepted: Orders

## 2022-11-21 NOTE — Telephone Encounter (Signed)
Orders were entered as future labs.  Please call pt to schedule lab only visit

## 2022-12-04 ENCOUNTER — Encounter: Payer: 59 | Admitting: Family Medicine

## 2022-12-05 ENCOUNTER — Other Ambulatory Visit (INDEPENDENT_AMBULATORY_CARE_PROVIDER_SITE_OTHER): Payer: 59

## 2022-12-05 DIAGNOSIS — E559 Vitamin D deficiency, unspecified: Secondary | ICD-10-CM

## 2022-12-05 DIAGNOSIS — N921 Excessive and frequent menstruation with irregular cycle: Secondary | ICD-10-CM | POA: Diagnosis not present

## 2022-12-05 DIAGNOSIS — Z0000 Encounter for general adult medical examination without abnormal findings: Secondary | ICD-10-CM | POA: Diagnosis not present

## 2022-12-05 LAB — BASIC METABOLIC PANEL
BUN: 16 mg/dL (ref 6–23)
CO2: 29 mEq/L (ref 19–32)
Calcium: 8.9 mg/dL (ref 8.4–10.5)
Chloride: 102 mEq/L (ref 96–112)
Creatinine, Ser: 0.81 mg/dL (ref 0.40–1.20)
GFR: 83.01 mL/min (ref >60.00)
Glucose, Bld: 87 mg/dL (ref 70–99)
Potassium: 4.1 mEq/L (ref 3.5–5.1)
Sodium: 137 mEq/L (ref 135–145)

## 2022-12-05 LAB — HEPATIC FUNCTION PANEL
ALT: 13 U/L (ref 0–35)
AST: 19 U/L (ref 0–37)
Albumin: 4.1 g/dL (ref 3.5–5.2)
Alkaline Phosphatase: 58 U/L (ref 39–117)
Bilirubin, Direct: 0.1 mg/dL (ref 0.0–0.3)
Total Bilirubin: 0.5 mg/dL (ref 0.2–1.2)
Total Protein: 6.5 g/dL (ref 6.0–8.3)

## 2022-12-05 LAB — CBC WITH DIFFERENTIAL/PLATELET
Basophils Absolute: 0 10*3/uL (ref 0.0–0.1)
Basophils Relative: 0.7 % (ref 0.0–3.0)
Eosinophils Absolute: 0.1 10*3/uL (ref 0.0–0.7)
Eosinophils Relative: 3 % (ref 0.0–5.0)
HCT: 41.5 % (ref 36.0–46.0)
Hemoglobin: 13.5 g/dL (ref 12.0–15.0)
Lymphocytes Relative: 22.9 % (ref 12.0–46.0)
Lymphs Abs: 1 10*3/uL (ref 0.7–4.0)
MCHC: 32.5 g/dL (ref 30.0–36.0)
MCV: 89.2 fl (ref 78.0–100.0)
Monocytes Absolute: 0.3 10*3/uL (ref 0.1–1.0)
Monocytes Relative: 7.3 % (ref 3.0–12.0)
Neutro Abs: 3 10*3/uL (ref 1.4–7.7)
Neutrophils Relative %: 66.1 % (ref 43.0–77.0)
Platelets: 196 10*3/uL (ref 150.0–400.0)
RBC: 4.65 Mil/uL (ref 3.87–5.11)
RDW: 13.3 % (ref 11.5–15.5)
WBC: 4.6 10*3/uL (ref 4.0–10.5)

## 2022-12-05 LAB — LIPID PANEL
Cholesterol: 201 mg/dL — ABNORMAL HIGH (ref 0–200)
HDL: 84.3 mg/dL (ref >39.00)
LDL Cholesterol: 108 mg/dL — ABNORMAL HIGH (ref 0–99)
NonHDL: 116.24
Total CHOL/HDL Ratio: 2
Triglycerides: 43 mg/dL (ref 0.0–149.0)
VLDL: 8.6 mg/dL (ref 0.0–40.0)

## 2022-12-05 LAB — VITAMIN D 25 HYDROXY (VIT D DEFICIENCY, FRACTURES): VITD: 39.13 ng/mL (ref 30.00–100.00)

## 2022-12-05 LAB — TSH: TSH: 1.28 u[IU]/mL (ref 0.35–5.50)

## 2022-12-05 NOTE — Progress Notes (Signed)
Pt came for labs only, tolerated draw well.   

## 2022-12-06 ENCOUNTER — Telehealth: Payer: Self-pay

## 2022-12-06 NOTE — Telephone Encounter (Signed)
Pt is aware lab results.

## 2022-12-06 NOTE — Telephone Encounter (Signed)
-----   Message from Neena Rhymes sent at 12/06/2022  7:15 AM EDT ----- Labs look great!  No changes at this time

## 2022-12-07 ENCOUNTER — Other Ambulatory Visit (HOSPITAL_COMMUNITY): Payer: Self-pay

## 2022-12-07 ENCOUNTER — Encounter: Payer: Self-pay | Admitting: Family Medicine

## 2022-12-07 ENCOUNTER — Ambulatory Visit (INDEPENDENT_AMBULATORY_CARE_PROVIDER_SITE_OTHER): Payer: 59 | Admitting: Family Medicine

## 2022-12-07 VITALS — BP 104/78 | HR 73 | Temp 98.1°F | Resp 18 | Ht 71.5 in | Wt 165.2 lb

## 2022-12-07 DIAGNOSIS — Z0000 Encounter for general adult medical examination without abnormal findings: Secondary | ICD-10-CM | POA: Diagnosis not present

## 2022-12-07 DIAGNOSIS — E559 Vitamin D deficiency, unspecified: Secondary | ICD-10-CM

## 2022-12-07 MED ORDER — FLUOXETINE HCL 10 MG PO TABS
10.0000 mg | ORAL_TABLET | Freq: Every day | ORAL | 1 refills | Status: DC
  Filled 2022-12-07 – 2023-01-25 (×2): qty 90, 90d supply, fill #0
  Filled 2023-04-24: qty 90, 90d supply, fill #1

## 2022-12-07 NOTE — Patient Instructions (Signed)
Follow up in 1 year or as needed Your labs look great!  No changes at this time Keep up the good work on healthy diet and regular exercise- you look great! Call with any questions or concerns Stay Safe!  Stay Healthy! Enjoy your trip!!!

## 2022-12-07 NOTE — Assessment & Plan Note (Signed)
Check labs and replete prn. 

## 2022-12-07 NOTE — Assessment & Plan Note (Signed)
Pt's PE WNL.  UTD on pap, mammo, cologuard, Tdap.  Reviewed labs- look great!  Anticipatory guidance provided.

## 2022-12-07 NOTE — Progress Notes (Signed)
   Subjective:    Patient ID: Ashley Quinn, female    DOB: [birth year], 51 y.o.   MRN: [id number]  HPI CPE- UTD on pap, mammo, colonoscopy, Tdap  Patient Care Team    Relationship Specialty Notifications Start End  Sheliah Hatch, MD PCP - General Family Medicine  05/23/21   Patton Salles, MD Consulting Physician Obstetrics and Gynecology  09/30/19      Health Maintenance  Topic Date Due   INFLUENZA VACCINE  11/09/2022   PAP SMEAR-Modifier  09/18/2023   MAMMOGRAM  05/04/2024   Colonoscopy  11/12/2030   DTaP/Tdap/Td (2 - Td or Tdap) 05/24/2031   Hepatitis C Screening  Completed   HIV Screening  Completed   HPV VACCINES  Aged Out   COVID-19 Vaccine  Discontinued   Zoster Vaccines- Shingrix  Discontinued      Review of Systems Patient reports no vision/ hearing changes, adenopathy,fever, weight change,  persistant/recurrent hoarseness , swallowing issues, chest pain, palpitations, edema, persistant/recurrent cough, hemoptysis, dyspnea (rest/exertional/paroxysmal nocturnal), gastrointestinal bleeding (melena, rectal bleeding), abdominal pain, significant heartburn, bowel changes, GU symptoms (dysuria, hematuria, incontinence), Gyn symptoms (abnormal  bleeding, pain),  syncope, focal weakness, memory loss, numbness & tingling, skin/hair/nail changes, abnormal bruising, anxiety, or depression.     Objective:   Physical Exam General Appearance:    Alert, cooperative, no distress, appears stated age  Head:    Normocephalic, without obvious abnormality, atraumatic  Eyes:    PERRL, conjunctiva/corneas clear, EOM's intact both eyes  Ears:    Normal TM's and external ear canals, both ears  Nose:   Nares normal, septum midline, mucosa normal, no drainage    or sinus tenderness  Throat:   Lips, mucosa, and tongue normal; teeth and gums normal  Neck:   Supple, symmetrical, trachea midline, no adenopathy;    Thyroid: no enlargement/tenderness/nodules  Back:      Symmetric, no curvature, ROM normal, no CVA tenderness  Lungs:     Clear to auscultation bilaterally, respirations unlabored  Chest Wall:    No tenderness or deformity   Heart:    Regular rate and rhythm, S1 and S2 normal, no murmur, rub   or gallop  Breast Exam:    Deferred to GYN  Abdomen:     Soft, non-tender, bowel sounds active all four quadrants,    no masses, no organomegaly  Genitalia:    Deferred to GYN  Rectal:    Extremities:   Extremities normal, atraumatic, no cyanosis or edema  Pulses:   2+ and symmetric all extremities  Skin:   Skin color, texture, turgor normal, no rashes or lesions  Lymph nodes:   Cervical, supraclavicular, and axillary nodes normal  Neurologic:   CNII-XII intact, normal strength, sensation and reflexes    throughout          Assessment & Plan:

## 2023-01-10 ENCOUNTER — Other Ambulatory Visit (HOSPITAL_BASED_OUTPATIENT_CLINIC_OR_DEPARTMENT_OTHER): Payer: Self-pay

## 2023-01-11 ENCOUNTER — Other Ambulatory Visit (HOSPITAL_COMMUNITY): Payer: Self-pay

## 2023-01-15 DIAGNOSIS — M9903 Segmental and somatic dysfunction of lumbar region: Secondary | ICD-10-CM | POA: Diagnosis not present

## 2023-01-15 DIAGNOSIS — M4316 Spondylolisthesis, lumbar region: Secondary | ICD-10-CM | POA: Diagnosis not present

## 2023-01-15 DIAGNOSIS — M47812 Spondylosis without myelopathy or radiculopathy, cervical region: Secondary | ICD-10-CM | POA: Diagnosis not present

## 2023-01-15 DIAGNOSIS — M9905 Segmental and somatic dysfunction of pelvic region: Secondary | ICD-10-CM | POA: Diagnosis not present

## 2023-01-15 DIAGNOSIS — M47817 Spondylosis without myelopathy or radiculopathy, lumbosacral region: Secondary | ICD-10-CM | POA: Diagnosis not present

## 2023-01-15 DIAGNOSIS — M9901 Segmental and somatic dysfunction of cervical region: Secondary | ICD-10-CM | POA: Diagnosis not present

## 2023-01-15 DIAGNOSIS — M5032 Other cervical disc degeneration, mid-cervical region, unspecified level: Secondary | ICD-10-CM | POA: Diagnosis not present

## 2023-01-15 DIAGNOSIS — M4714 Other spondylosis with myelopathy, thoracic region: Secondary | ICD-10-CM | POA: Diagnosis not present

## 2023-01-15 DIAGNOSIS — M9902 Segmental and somatic dysfunction of thoracic region: Secondary | ICD-10-CM | POA: Diagnosis not present

## 2023-01-22 DIAGNOSIS — M4316 Spondylolisthesis, lumbar region: Secondary | ICD-10-CM | POA: Diagnosis not present

## 2023-01-22 DIAGNOSIS — M9905 Segmental and somatic dysfunction of pelvic region: Secondary | ICD-10-CM | POA: Diagnosis not present

## 2023-01-22 DIAGNOSIS — M9902 Segmental and somatic dysfunction of thoracic region: Secondary | ICD-10-CM | POA: Diagnosis not present

## 2023-01-22 DIAGNOSIS — M47812 Spondylosis without myelopathy or radiculopathy, cervical region: Secondary | ICD-10-CM | POA: Diagnosis not present

## 2023-01-22 DIAGNOSIS — M5032 Other cervical disc degeneration, mid-cervical region, unspecified level: Secondary | ICD-10-CM | POA: Diagnosis not present

## 2023-01-22 DIAGNOSIS — M47817 Spondylosis without myelopathy or radiculopathy, lumbosacral region: Secondary | ICD-10-CM | POA: Diagnosis not present

## 2023-01-22 DIAGNOSIS — M9903 Segmental and somatic dysfunction of lumbar region: Secondary | ICD-10-CM | POA: Diagnosis not present

## 2023-01-22 DIAGNOSIS — M9901 Segmental and somatic dysfunction of cervical region: Secondary | ICD-10-CM | POA: Diagnosis not present

## 2023-01-22 DIAGNOSIS — M4714 Other spondylosis with myelopathy, thoracic region: Secondary | ICD-10-CM | POA: Diagnosis not present

## 2023-01-24 DIAGNOSIS — M9901 Segmental and somatic dysfunction of cervical region: Secondary | ICD-10-CM | POA: Diagnosis not present

## 2023-01-24 DIAGNOSIS — M4714 Other spondylosis with myelopathy, thoracic region: Secondary | ICD-10-CM | POA: Diagnosis not present

## 2023-01-24 DIAGNOSIS — M47812 Spondylosis without myelopathy or radiculopathy, cervical region: Secondary | ICD-10-CM | POA: Diagnosis not present

## 2023-01-24 DIAGNOSIS — M9905 Segmental and somatic dysfunction of pelvic region: Secondary | ICD-10-CM | POA: Diagnosis not present

## 2023-01-24 DIAGNOSIS — M47817 Spondylosis without myelopathy or radiculopathy, lumbosacral region: Secondary | ICD-10-CM | POA: Diagnosis not present

## 2023-01-24 DIAGNOSIS — M4316 Spondylolisthesis, lumbar region: Secondary | ICD-10-CM | POA: Diagnosis not present

## 2023-01-24 DIAGNOSIS — M9903 Segmental and somatic dysfunction of lumbar region: Secondary | ICD-10-CM | POA: Diagnosis not present

## 2023-01-24 DIAGNOSIS — M5032 Other cervical disc degeneration, mid-cervical region, unspecified level: Secondary | ICD-10-CM | POA: Diagnosis not present

## 2023-01-24 DIAGNOSIS — M9902 Segmental and somatic dysfunction of thoracic region: Secondary | ICD-10-CM | POA: Diagnosis not present

## 2023-01-25 ENCOUNTER — Other Ambulatory Visit (HOSPITAL_BASED_OUTPATIENT_CLINIC_OR_DEPARTMENT_OTHER): Payer: Self-pay

## 2023-01-30 DIAGNOSIS — M9905 Segmental and somatic dysfunction of pelvic region: Secondary | ICD-10-CM | POA: Diagnosis not present

## 2023-01-30 DIAGNOSIS — M9903 Segmental and somatic dysfunction of lumbar region: Secondary | ICD-10-CM | POA: Diagnosis not present

## 2023-01-30 DIAGNOSIS — M9902 Segmental and somatic dysfunction of thoracic region: Secondary | ICD-10-CM | POA: Diagnosis not present

## 2023-01-30 DIAGNOSIS — M5032 Other cervical disc degeneration, mid-cervical region, unspecified level: Secondary | ICD-10-CM | POA: Diagnosis not present

## 2023-01-30 DIAGNOSIS — M47817 Spondylosis without myelopathy or radiculopathy, lumbosacral region: Secondary | ICD-10-CM | POA: Diagnosis not present

## 2023-01-30 DIAGNOSIS — M9901 Segmental and somatic dysfunction of cervical region: Secondary | ICD-10-CM | POA: Diagnosis not present

## 2023-01-30 DIAGNOSIS — M4714 Other spondylosis with myelopathy, thoracic region: Secondary | ICD-10-CM | POA: Diagnosis not present

## 2023-01-30 DIAGNOSIS — M4316 Spondylolisthesis, lumbar region: Secondary | ICD-10-CM | POA: Diagnosis not present

## 2023-01-30 DIAGNOSIS — M47812 Spondylosis without myelopathy or radiculopathy, cervical region: Secondary | ICD-10-CM | POA: Diagnosis not present

## 2023-02-07 DIAGNOSIS — M9905 Segmental and somatic dysfunction of pelvic region: Secondary | ICD-10-CM | POA: Diagnosis not present

## 2023-02-07 DIAGNOSIS — M9901 Segmental and somatic dysfunction of cervical region: Secondary | ICD-10-CM | POA: Diagnosis not present

## 2023-02-07 DIAGNOSIS — M47817 Spondylosis without myelopathy or radiculopathy, lumbosacral region: Secondary | ICD-10-CM | POA: Diagnosis not present

## 2023-02-07 DIAGNOSIS — M4714 Other spondylosis with myelopathy, thoracic region: Secondary | ICD-10-CM | POA: Diagnosis not present

## 2023-02-07 DIAGNOSIS — M9902 Segmental and somatic dysfunction of thoracic region: Secondary | ICD-10-CM | POA: Diagnosis not present

## 2023-02-07 DIAGNOSIS — M47812 Spondylosis without myelopathy or radiculopathy, cervical region: Secondary | ICD-10-CM | POA: Diagnosis not present

## 2023-02-07 DIAGNOSIS — M9903 Segmental and somatic dysfunction of lumbar region: Secondary | ICD-10-CM | POA: Diagnosis not present

## 2023-02-07 DIAGNOSIS — M4316 Spondylolisthesis, lumbar region: Secondary | ICD-10-CM | POA: Diagnosis not present

## 2023-02-07 DIAGNOSIS — M5032 Other cervical disc degeneration, mid-cervical region, unspecified level: Secondary | ICD-10-CM | POA: Diagnosis not present

## 2023-02-14 DIAGNOSIS — M9901 Segmental and somatic dysfunction of cervical region: Secondary | ICD-10-CM | POA: Diagnosis not present

## 2023-02-14 DIAGNOSIS — M9905 Segmental and somatic dysfunction of pelvic region: Secondary | ICD-10-CM | POA: Diagnosis not present

## 2023-02-14 DIAGNOSIS — M9902 Segmental and somatic dysfunction of thoracic region: Secondary | ICD-10-CM | POA: Diagnosis not present

## 2023-02-14 DIAGNOSIS — M9903 Segmental and somatic dysfunction of lumbar region: Secondary | ICD-10-CM | POA: Diagnosis not present

## 2023-02-14 DIAGNOSIS — M47817 Spondylosis without myelopathy or radiculopathy, lumbosacral region: Secondary | ICD-10-CM | POA: Diagnosis not present

## 2023-02-14 DIAGNOSIS — M4316 Spondylolisthesis, lumbar region: Secondary | ICD-10-CM | POA: Diagnosis not present

## 2023-02-14 DIAGNOSIS — M47812 Spondylosis without myelopathy or radiculopathy, cervical region: Secondary | ICD-10-CM | POA: Diagnosis not present

## 2023-02-14 DIAGNOSIS — M4714 Other spondylosis with myelopathy, thoracic region: Secondary | ICD-10-CM | POA: Diagnosis not present

## 2023-02-14 DIAGNOSIS — M5032 Other cervical disc degeneration, mid-cervical region, unspecified level: Secondary | ICD-10-CM | POA: Diagnosis not present

## 2023-02-23 DIAGNOSIS — M9903 Segmental and somatic dysfunction of lumbar region: Secondary | ICD-10-CM | POA: Diagnosis not present

## 2023-02-23 DIAGNOSIS — M9901 Segmental and somatic dysfunction of cervical region: Secondary | ICD-10-CM | POA: Diagnosis not present

## 2023-02-23 DIAGNOSIS — M5032 Other cervical disc degeneration, mid-cervical region, unspecified level: Secondary | ICD-10-CM | POA: Diagnosis not present

## 2023-02-23 DIAGNOSIS — M47817 Spondylosis without myelopathy or radiculopathy, lumbosacral region: Secondary | ICD-10-CM | POA: Diagnosis not present

## 2023-02-23 DIAGNOSIS — M47812 Spondylosis without myelopathy or radiculopathy, cervical region: Secondary | ICD-10-CM | POA: Diagnosis not present

## 2023-02-23 DIAGNOSIS — M9902 Segmental and somatic dysfunction of thoracic region: Secondary | ICD-10-CM | POA: Diagnosis not present

## 2023-02-23 DIAGNOSIS — M4714 Other spondylosis with myelopathy, thoracic region: Secondary | ICD-10-CM | POA: Diagnosis not present

## 2023-02-23 DIAGNOSIS — M4316 Spondylolisthesis, lumbar region: Secondary | ICD-10-CM | POA: Diagnosis not present

## 2023-02-23 DIAGNOSIS — M9905 Segmental and somatic dysfunction of pelvic region: Secondary | ICD-10-CM | POA: Diagnosis not present

## 2023-03-20 DIAGNOSIS — M47817 Spondylosis without myelopathy or radiculopathy, lumbosacral region: Secondary | ICD-10-CM | POA: Diagnosis not present

## 2023-03-20 DIAGNOSIS — M4316 Spondylolisthesis, lumbar region: Secondary | ICD-10-CM | POA: Diagnosis not present

## 2023-03-20 DIAGNOSIS — M5032 Other cervical disc degeneration, mid-cervical region, unspecified level: Secondary | ICD-10-CM | POA: Diagnosis not present

## 2023-03-20 DIAGNOSIS — M9901 Segmental and somatic dysfunction of cervical region: Secondary | ICD-10-CM | POA: Diagnosis not present

## 2023-03-20 DIAGNOSIS — M9903 Segmental and somatic dysfunction of lumbar region: Secondary | ICD-10-CM | POA: Diagnosis not present

## 2023-03-20 DIAGNOSIS — M9905 Segmental and somatic dysfunction of pelvic region: Secondary | ICD-10-CM | POA: Diagnosis not present

## 2023-03-20 DIAGNOSIS — M9902 Segmental and somatic dysfunction of thoracic region: Secondary | ICD-10-CM | POA: Diagnosis not present

## 2023-03-20 DIAGNOSIS — M4714 Other spondylosis with myelopathy, thoracic region: Secondary | ICD-10-CM | POA: Diagnosis not present

## 2023-03-20 DIAGNOSIS — M47812 Spondylosis without myelopathy or radiculopathy, cervical region: Secondary | ICD-10-CM | POA: Diagnosis not present

## 2023-04-17 DIAGNOSIS — M9901 Segmental and somatic dysfunction of cervical region: Secondary | ICD-10-CM | POA: Diagnosis not present

## 2023-04-17 DIAGNOSIS — M9902 Segmental and somatic dysfunction of thoracic region: Secondary | ICD-10-CM | POA: Diagnosis not present

## 2023-04-17 DIAGNOSIS — M4714 Other spondylosis with myelopathy, thoracic region: Secondary | ICD-10-CM | POA: Diagnosis not present

## 2023-04-17 DIAGNOSIS — M9903 Segmental and somatic dysfunction of lumbar region: Secondary | ICD-10-CM | POA: Diagnosis not present

## 2023-04-17 DIAGNOSIS — M47817 Spondylosis without myelopathy or radiculopathy, lumbosacral region: Secondary | ICD-10-CM | POA: Diagnosis not present

## 2023-04-17 DIAGNOSIS — M47812 Spondylosis without myelopathy or radiculopathy, cervical region: Secondary | ICD-10-CM | POA: Diagnosis not present

## 2023-04-17 DIAGNOSIS — M9905 Segmental and somatic dysfunction of pelvic region: Secondary | ICD-10-CM | POA: Diagnosis not present

## 2023-04-17 DIAGNOSIS — M5032 Other cervical disc degeneration, mid-cervical region, unspecified level: Secondary | ICD-10-CM | POA: Diagnosis not present

## 2023-04-17 DIAGNOSIS — M4316 Spondylolisthesis, lumbar region: Secondary | ICD-10-CM | POA: Diagnosis not present

## 2023-04-23 ENCOUNTER — Other Ambulatory Visit: Payer: Self-pay | Admitting: Obstetrics and Gynecology

## 2023-04-23 ENCOUNTER — Other Ambulatory Visit (HOSPITAL_BASED_OUTPATIENT_CLINIC_OR_DEPARTMENT_OTHER): Payer: Self-pay

## 2023-04-23 DIAGNOSIS — Z1231 Encounter for screening mammogram for malignant neoplasm of breast: Secondary | ICD-10-CM

## 2023-04-23 MED ORDER — CYCLOSPORINE 0.05 % OP EMUL
1.0000 [drp] | Freq: Two times a day (BID) | OPHTHALMIC | 4 refills | Status: AC
  Filled 2023-04-23: qty 180, 90d supply, fill #0

## 2023-04-24 ENCOUNTER — Other Ambulatory Visit (HOSPITAL_BASED_OUTPATIENT_CLINIC_OR_DEPARTMENT_OTHER): Payer: Self-pay

## 2023-04-27 ENCOUNTER — Other Ambulatory Visit (HOSPITAL_BASED_OUTPATIENT_CLINIC_OR_DEPARTMENT_OTHER): Payer: Self-pay

## 2023-05-08 DIAGNOSIS — L821 Other seborrheic keratosis: Secondary | ICD-10-CM | POA: Diagnosis not present

## 2023-05-08 DIAGNOSIS — D1801 Hemangioma of skin and subcutaneous tissue: Secondary | ICD-10-CM | POA: Diagnosis not present

## 2023-05-08 DIAGNOSIS — Z85820 Personal history of malignant melanoma of skin: Secondary | ICD-10-CM | POA: Diagnosis not present

## 2023-05-08 DIAGNOSIS — D224 Melanocytic nevi of scalp and neck: Secondary | ICD-10-CM | POA: Diagnosis not present

## 2023-05-08 DIAGNOSIS — D225 Melanocytic nevi of trunk: Secondary | ICD-10-CM | POA: Diagnosis not present

## 2023-05-08 DIAGNOSIS — D2262 Melanocytic nevi of left upper limb, including shoulder: Secondary | ICD-10-CM | POA: Diagnosis not present

## 2023-05-08 DIAGNOSIS — L814 Other melanin hyperpigmentation: Secondary | ICD-10-CM | POA: Diagnosis not present

## 2023-05-10 ENCOUNTER — Ambulatory Visit
Admission: RE | Admit: 2023-05-10 | Discharge: 2023-05-10 | Disposition: A | Discharge status: Home / Self Care | Payer: Commercial Managed Care - PPO | Source: Ambulatory Visit | Attending: Obstetrics and Gynecology | Admitting: Obstetrics and Gynecology

## 2023-05-10 DIAGNOSIS — Z1231 Encounter for screening mammogram for malignant neoplasm of breast: Secondary | ICD-10-CM

## 2023-05-14 ENCOUNTER — Encounter: Payer: Self-pay | Admitting: Obstetrics and Gynecology

## 2023-07-30 ENCOUNTER — Other Ambulatory Visit (HOSPITAL_BASED_OUTPATIENT_CLINIC_OR_DEPARTMENT_OTHER): Payer: Self-pay

## 2023-07-30 ENCOUNTER — Other Ambulatory Visit: Payer: Self-pay | Admitting: Family Medicine

## 2023-07-30 MED ORDER — FLUOXETINE HCL 10 MG PO TABS
10.0000 mg | ORAL_TABLET | Freq: Every day | ORAL | 1 refills | Status: DC
  Filled 2023-07-30 (×2): qty 90, 90d supply, fill #0
  Filled 2023-10-26: qty 90, 90d supply, fill #1

## 2023-07-31 ENCOUNTER — Other Ambulatory Visit: Payer: Self-pay

## 2023-08-10 ENCOUNTER — Ambulatory Visit: Admitting: Family Medicine

## 2023-08-10 ENCOUNTER — Other Ambulatory Visit (HOSPITAL_BASED_OUTPATIENT_CLINIC_OR_DEPARTMENT_OTHER): Payer: Self-pay

## 2023-08-10 VITALS — BP 108/72 | HR 66 | Temp 97.8°F | Ht 71.5 in | Wt 173.6 lb

## 2023-08-10 DIAGNOSIS — N951 Menopausal and female climacteric states: Secondary | ICD-10-CM | POA: Insufficient documentation

## 2023-08-10 DIAGNOSIS — R635 Abnormal weight gain: Secondary | ICD-10-CM | POA: Insufficient documentation

## 2023-08-10 MED ORDER — PROGESTERONE MICRONIZED 100 MG PO CAPS
100.0000 mg | ORAL_CAPSULE | Freq: Every day | ORAL | 1 refills | Status: DC
  Filled 2023-08-10 – 2023-09-18 (×2): qty 90, 90d supply, fill #0
  Filled 2023-12-13: qty 90, 90d supply, fill #1

## 2023-08-10 MED ORDER — DHEA 25 MG PO CAPS
1.0000 | ORAL_CAPSULE | Freq: Every day | ORAL | 1 refills | Status: DC
  Filled 2023-08-10: qty 90, 90d supply, fill #0

## 2023-08-10 MED ORDER — ESTRADIOL 1 MG PO TABS
1.0000 mg | ORAL_TABLET | Freq: Every day | ORAL | 1 refills | Status: DC
  Filled 2023-08-10 – 2023-09-18 (×2): qty 90, 90d supply, fill #0
  Filled 2023-12-13: qty 90, 90d supply, fill #1

## 2023-08-10 NOTE — Assessment & Plan Note (Signed)
 New to provider.  She has GYN but they were not willing to look into HRT.  She reports that since starting Estradiol  1mg , Progesterone  100mg , and DHEA 25mg  she is feeling much better.  She started meds in December.  No personal hx of breast or GYN cancers but mom did have breast cancer at age 54 after decades of HRT.  Did discuss theoretical increased risk of breast cancer.  Pt is aware and willing to accept risk.  Her plan is to taper off as symptoms allow.  Will assume prescribing responsibility and follow closely.

## 2023-08-10 NOTE — Assessment & Plan Note (Signed)
 New.  Pt has gained 8 lbs since August.  Weight gain is around her abdomen.  This is frustrating to her as she is eating well and exercising.  BMI is still in normal range but I understand that she is upset w/ changing shape.  Encouraged her to give herself some grace, see how things change on the hormones, and continue to work on healthy diet and regular exercise.  Will follow.

## 2023-08-10 NOTE — Patient Instructions (Addendum)
 Schedule your complete physical in September No med changes at this time- you're doing great! Keep up the good work on healthy diet and regular exercise- you look great! Call with any questions or concerns Stay Safe!  Stay Healthy! Happy Early [place]!!

## 2023-08-10 NOTE — Progress Notes (Signed)
   Subjective:    Patient ID: Ashley Quinn, female    DOB: 1970-12-08, 53 y.o.   MRN: [id number]  HPI Weight gain- pt has gained 8 lbs since August.  BMI 23.87.  pt is frustrated by her weight gain around the middle.  Has started exercising daily at 5am.  Is restrictive of calorie intake.  Due to her frustration, she sought out online HRT after doing research.  HRT- pt used online physician Selestino Dakin).  Currently on estradiol  1mg , Progesterone  100mg , DHEA 25mg .  Pt started meds for hair loss, joint pain, poor sleep, lack of energy.  Pt reports feeling better overall.  Would like me to manage her medication rather than paying a separate provider to do so.  No personal hx of breast or GYN cancers, mom w/ breast cancer at age 50 (after decades of HRT).   Review of Systems For ROS see HPI     Objective:   Physical Exam Vitals reviewed.  Constitutional:      General: She is not in acute distress.    Appearance: Normal appearance. She is not ill-appearing.  HENT:     Head: Normocephalic and atraumatic.  Eyes:     Extraocular Movements: Extraocular movements intact.     Conjunctiva/sclera: Conjunctivae normal.  Cardiovascular:     Rate and Rhythm: Normal rate and regular rhythm.  Pulmonary:     Effort: Pulmonary effort is normal. No respiratory distress.  Skin:    General: Skin is warm and dry.  Neurological:     General: No focal deficit present.     Mental Status: She is alert and oriented to person, place, and time.  Psychiatric:        Mood and Affect: Mood normal.        Behavior: Behavior normal.        Thought Content: Thought content normal.           Assessment & Plan:

## 2023-08-20 ENCOUNTER — Other Ambulatory Visit (HOSPITAL_BASED_OUTPATIENT_CLINIC_OR_DEPARTMENT_OTHER): Payer: Self-pay

## 2023-08-20 DIAGNOSIS — Z85820 Personal history of malignant melanoma of skin: Secondary | ICD-10-CM | POA: Diagnosis not present

## 2023-08-20 DIAGNOSIS — L570 Actinic keratosis: Secondary | ICD-10-CM | POA: Diagnosis not present

## 2023-09-10 ENCOUNTER — Other Ambulatory Visit (HOSPITAL_COMMUNITY): Payer: Self-pay

## 2023-09-10 ENCOUNTER — Telehealth: Admitting: Family Medicine

## 2023-09-10 DIAGNOSIS — J019 Acute sinusitis, unspecified: Secondary | ICD-10-CM | POA: Diagnosis not present

## 2023-09-10 DIAGNOSIS — B9689 Other specified bacterial agents as the cause of diseases classified elsewhere: Secondary | ICD-10-CM | POA: Diagnosis not present

## 2023-09-10 MED ORDER — AMOXICILLIN-POT CLAVULANATE 875-125 MG PO TABS
1.0000 | ORAL_TABLET | Freq: Two times a day (BID) | ORAL | 0 refills | Status: AC
  Filled 2023-09-10: qty 14, 7d supply, fill #0

## 2023-09-10 NOTE — Progress Notes (Signed)
 Virtual Visit Consent   Ashley Quinn, you are scheduled for a virtual visit with a [facility] provider today. Just as with appointments in the office, your consent must be obtained to participate. Your consent will be active for this visit and any virtual visit you may have with one of our providers in the next 365 days. If you have a MyChart account, a copy of this consent can be sent to you electronically.  As this is a virtual visit, video technology does not allow for your provider to perform a traditional examination. This may limit your provider's ability to fully assess your condition. If your provider identifies any concerns that need to be evaluated in person or the need to arrange testing (such as labs, EKG, etc.), we will make arrangements to do so. Although advances in technology are sophisticated, we cannot ensure that it will always work on either your end or our end. If the connection with a video visit is poor, the visit may have to be switched to a telephone visit. With either a video or telephone visit, we are not always able to ensure that we have a secure connection.  By engaging in this virtual visit, you consent to the provision of healthcare and authorize for your insurance to be billed (if applicable) for the services provided during this visit. Depending on your insurance coverage, you may receive a charge related to this service.  I need to obtain your verbal consent now. Are you willing to proceed with your visit today? Ashley Quinn has provided verbal consent on 09/10/2023 for a virtual visit (video or telephone). Lanetta Pion, NP  Date: 09/10/2023 12:20 PM   Virtual Visit via Video Note   I, Lanetta Pion, connected with  Ashley Quinn  ([id number], [birth year]) on 09/10/23 at 12:15 PM EDT by a video-enabled telemedicine application and verified that I am speaking with the correct person using two identifiers.  Location: Patient: Virtual Visit  Location Patient: Home Provider: Virtual Visit Location Provider: Home Office   I discussed the limitations of evaluation and management by telemedicine and the availability of in person appointments. The patient expressed understanding and agreed to proceed.    History of Present Illness: Ashley Quinn is a 53 y.o. who identifies as a female who was assigned female at birth, and is being seen today for sinus infection  Onset was 2 weeks with cold that seemed to improve but then worsened again. It was 14 days of sinus congestion and mucus. Thought was getting better than 5-6 days developed sinus pressure, throbbing in right ear.  Modifying factors are mucinex-sudafed, neti pot, zytrec daily, and tylenol  and ibuprofen  Denies chest pain, shortness of breath, fevers, chills  Exposure to sick contacts- unknown  Problems:  Patient Active Problem List   Diagnosis Date Noted   Perimenopause 08/10/2023   Weight gain 08/10/2023   Anxiety 08/21/2022   Physical exam 11/30/2021   Vitamin D  deficiency 05/31/2021   Insomnia 05/31/2021   Acquired hammer toe deformity of lesser toe of left foot 05/23/2021   Wrist pain, right 02/03/2014   Menorrhagia 06/26/2013    Allergies:  Allergies  Allergen Reactions   Codeine Nausea Only   Medications:  Current Outpatient Medications:    ASHWAGANDHA PO, Take 1 capsule by mouth daily., Disp: , Rfl:    B Complex-C (B-COMPLEX WITH VITAMIN C) tablet, Take 1 tablet by mouth daily., Disp: , Rfl:    cetirizine (  ZYRTEC) 10 MG tablet, Take 10 mg by mouth daily as needed for allergies. , Disp: , Rfl:    Cholecalciferol (VITAMIN D ) 125 MCG (5000 UT) CAPS, Take 5,000 Units by mouth daily., Disp: , Rfl:    cycloSPORINE  (RESTASIS ) 0.05 % ophthalmic emulsion, Place 1 drop into both eyes every 12 (twelve) hours., Disp: 180 each, Rfl: 4   DHEA 25 MG CAPS, Take 1 capsule (25 mg total) by mouth daily., Disp: 90 capsule, Rfl: 1   estradiol  (ESTRACE ) 1 MG tablet,  Take 1 tablet (1 mg total) by mouth daily., Disp: 90 tablet, Rfl: 1   FLUoxetine  (PROZAC ) 10 MG tablet, Take 1 tablet (10 mg total) by mouth daily., Disp: 90 tablet, Rfl: 1   Iron Combinations (IRON COMPLEX PO), Take 20 mg of iron by mouth daily., Disp: , Rfl:    magnesium 30 MG tablet, Take 30 mg by mouth at bedtime., Disp: , Rfl:    Omega-3 Fatty Acids (FISH OIL) 1000 MG CAPS, Take 1,000 mg by mouth daily., Disp: , Rfl:    progesterone  (PROMETRIUM ) 100 MG capsule, Take 1 capsule (100 mg total) by mouth daily., Disp: 90 capsule, Rfl: 1   TURMERIC PO, Take 1 capsule by mouth daily., Disp: , Rfl:    valACYclovir  (VALTREX ) 1000 MG tablet, Take 2 tablets by mouth now, repeat in 12 hours as needed for an outbreak., Disp: 20 tablet, Rfl: 1  Observations/Objective: Patient is well-developed, well-nourished in no acute distress.  Resting comfortably  at home.  Head is normocephalic, atraumatic.  No labored breathing.  Speech is clear and coherent with logical content.  Patient is alert and oriented at baseline.    Assessment and Plan:  1. Acute bacterial sinusitis (Primary)  - amoxicillin-clavulanate (AUGMENTIN) 875-125 MG tablet; Take 1 tablet by mouth 2 (two) times daily for 7 days.  Dispense: 14 tablet; Refill: 0  - Increased rest - Increasing Fluids - Acetaminophen  / ibuprofen  as needed for fever/pain.  -Flonase to help with sinus pressure and congestion (ear pain as well) - Saline nasal spray if congestion or if nasal passages feel dry. - Humidifying the air.     Reviewed side effects, risks and benefits of medication.    Patient acknowledged agreement and understanding of the plan.   Past Medical, Surgical, Social History, Allergies, and Medications have been Reviewed.      Follow Up Instructions: I discussed the assessment and treatment plan with the patient. The patient was provided an opportunity to ask questions and all were answered. The patient agreed with the plan  and demonstrated an understanding of the instructions.  A copy of instructions were sent to the patient via MyChart unless otherwise noted below.     The patient was advised to call back or seek an in-person evaluation if the symptoms worsen or if the condition fails to improve as anticipated.    Lanetta Pion, NP

## 2023-09-10 NOTE — Patient Instructions (Addendum)
 Ashley Quinn, thank you for joining Lanetta Pion, NP for today's virtual visit.  While this provider is not your primary care provider (PCP), if your PCP is located in our provider database this encounter information will be shared with them immediately following your visit.   A [facility] MyChart account gives you access to today's visit and all your visits, tests, and labs performed at [facility] " click here if you don't have a [facility] MyChart account or go to [email or web address]  Consent: (Patient) Ashley Quinn provided verbal consent for this virtual visit at the beginning of the encounter.  Current Medications:  Current Outpatient Medications:    amoxicillin-clavulanate (AUGMENTIN) 875-125 MG tablet, Take 1 tablet by mouth 2 (two) times daily for 7 days., Disp: 14 tablet, Rfl: 0   ASHWAGANDHA PO, Take 1 capsule by mouth daily., Disp: , Rfl:    B Complex-C (B-COMPLEX WITH VITAMIN C) tablet, Take 1 tablet by mouth daily., Disp: , Rfl:    cetirizine (ZYRTEC) 10 MG tablet, Take 10 mg by mouth daily as needed for allergies. , Disp: , Rfl:    Cholecalciferol (VITAMIN D ) 125 MCG (5000 UT) CAPS, Take 5,000 Units by mouth daily., Disp: , Rfl:    cycloSPORINE  (RESTASIS ) 0.05 % ophthalmic emulsion, Place 1 drop into both eyes every 12 (twelve) hours., Disp: 180 each, Rfl: 4   DHEA 25 MG CAPS, Take 1 capsule (25 mg total) by mouth daily., Disp: 90 capsule, Rfl: 1   estradiol  (ESTRACE ) 1 MG tablet, Take 1 tablet (1 mg total) by mouth daily., Disp: 90 tablet, Rfl: 1   FLUoxetine  (PROZAC ) 10 MG tablet, Take 1 tablet (10 mg total) by mouth daily., Disp: 90 tablet, Rfl: 1   Iron Combinations (IRON COMPLEX PO), Take 20 mg of iron by mouth daily., Disp: , Rfl:    magnesium 30 MG tablet, Take 30 mg by mouth at bedtime., Disp: , Rfl:    Omega-3 Fatty Acids (FISH OIL) 1000 MG CAPS, Take 1,000 mg by mouth daily., Disp: , Rfl:    progesterone  (PROMETRIUM ) 100 MG  capsule, Take 1 capsule (100 mg total) by mouth daily., Disp: 90 capsule, Rfl: 1   TURMERIC PO, Take 1 capsule by mouth daily., Disp: , Rfl:    valACYclovir  (VALTREX ) 1000 MG tablet, Take 2 tablets by mouth now, repeat in 12 hours as needed for an outbreak., Disp: 20 tablet, Rfl: 1   Medications ordered in this encounter:  Meds ordered this encounter  Medications   amoxicillin-clavulanate (AUGMENTIN) 875-125 MG tablet    Sig: Take 1 tablet by mouth 2 (two) times daily for 7 days.    Dispense:  14 tablet    Refill:  0    Supervising Provider:   Corine Dice [[id number]]     *If you need refills on other medications prior to your next appointment, please contact your pharmacy*  Follow-Up: Call back or seek an in-person evaluation if the symptoms worsen or if the condition fails to improve as anticipated.  [facility] [phone]  Other Instructions  - Increased rest - Increasing Fluids - Acetaminophen  / ibuprofen  as needed for fever/pain.  -Flonase to help with sinus pressure and congestion (ear pain as well) - Saline nasal spray if congestion or if nasal passages feel dry. - Humidifying the air.   Sinus Infection, Adult A sinus infection, also called sinusitis, is inflammation of your sinuses. Sinuses are hollow spaces in the bones around  your face. Your sinuses are located: Around your eyes. In the middle of your forehead. Behind your nose. In your cheekbones. Mucus normally drains out of your sinuses. When your nasal tissues become inflamed or swollen, mucus can become trapped or blocked. This allows bacteria, viruses, and fungi to grow, which leads to infection. Most infections of the sinuses are caused by a virus. A sinus infection can develop quickly. It can last for up to 4 weeks (acute) or for more than 12 weeks (chronic). A sinus infection often develops after a cold. What are the causes? This condition is caused by anything that creates swelling in the  sinuses or stops mucus from draining. This includes: Allergies. Asthma. Infection from bacteria or viruses. Deformities or blockages in your nose or sinuses. Abnormal growths in the nose (nasal polyps). Pollutants, such as chemicals or irritants in the air. Infection from fungi. This is rare. What increases the risk? You are more likely to develop this condition if you: Have a weak body defense system (immune system). Do a lot of swimming or diving. Overuse nasal sprays. Smoke. What are the signs or symptoms? The main symptoms of this condition are pain and a feeling of pressure around the affected sinuses. Other symptoms include: Stuffy nose or congestion that makes it difficult to breathe through your nose. Thick yellow or greenish drainage from your nose. Tenderness, swelling, and warmth over the affected sinuses. A cough that may get worse at night. Decreased sense of smell and taste. Extra mucus that collects in the throat or the back of the nose (postnasal drip) causing a sore throat or bad breath. Tiredness (fatigue). Fever. How is this diagnosed? This condition is diagnosed based on: Your symptoms. Your medical history. A physical exam. Tests to find out if your condition is acute or chronic. This may include: Checking your nose for nasal polyps. Viewing your sinuses using a device that has a light (endoscope). Testing for allergies or bacteria. Imaging tests, such as an MRI or CT scan. In rare cases, a bone biopsy may be done to rule out more serious types of fungal sinus disease. How is this treated? Treatment for a sinus infection depends on the cause and whether your condition is chronic or acute. If caused by a virus, your symptoms should go away on their own within 10 days. You may be given medicines to relieve symptoms. They include: Medicines that shrink swollen nasal passages (decongestants). A spray that eases inflammation of the nostrils (topical intranasal  corticosteroids). Rinses that help get rid of thick mucus in your nose (nasal saline washes). Medicines that treat allergies (antihistamines). Over-the-counter pain relievers. If caused by bacteria, your health care provider may recommend waiting to see if your symptoms improve. Most bacterial infections will get better without antibiotic medicine. You may be given antibiotics if you have: A severe infection. A weak immune system. If caused by narrow nasal passages or nasal polyps, surgery may be needed. Follow these instructions at home: Medicines Take, use, or apply over-the-counter and prescription medicines only as told by your health care provider. These may include nasal sprays. If you were prescribed an antibiotic medicine, take it as told by your health care provider. Do not stop taking the antibiotic even if you start to feel better. Hydrate and humidify  Drink enough fluid to keep your urine pale yellow. Staying hydrated will help to thin your mucus. Use a cool mist humidifier to keep the humidity level in your home above 50%. Inhale steam  for 10-15 minutes, 3-4 times a day, or as told by your health care provider. You can do this in the bathroom while a hot shower is running. Limit your exposure to cool or dry air. Rest Rest as much as possible. Sleep with your head raised (elevated). Make sure you get enough sleep each night. General instructions  Apply a warm, moist washcloth to your face 3-4 times a day or as told by your health care provider. This will help with discomfort. Use nasal saline washes as often as told by your health care provider. Wash your hands often with soap and water to reduce your exposure to germs. If soap and water are not available, use hand sanitizer. Do not smoke. Avoid being around people who are smoking (secondhand smoke). Keep all follow-up visits. This is important. Contact a health care provider if: You have a fever. Your symptoms get  worse. Your symptoms do not improve within 10 days. Get help right away if: You have a severe headache. You have persistent vomiting. You have severe pain or swelling around your face or eyes. You have vision problems. You develop confusion. Your neck is stiff. You have trouble breathing. These symptoms may be an emergency. Get help right away. Call 911. Do not wait to see if the symptoms will go away. Do not drive yourself to the hospital. Summary A sinus infection is soreness and inflammation of your sinuses. Sinuses are hollow spaces in the bones around your face. This condition is caused by nasal tissues that become inflamed or swollen. The swelling traps or blocks the flow of mucus. This allows bacteria, viruses, and fungi to grow, which leads to infection. If you were prescribed an antibiotic medicine, take it as told by your health care provider. Do not stop taking the antibiotic even if you start to feel better. Keep all follow-up visits. This is important. This information is not intended to replace advice given to you by your health care provider. Make sure you discuss any questions you have with your health care provider. Document Revised: 03/01/2021 Document Reviewed: 03/01/2021 Elsevier Patient Education  2024 Elsevier Inc.   If you have been instructed to have an in-person evaluation today at a local Urgent Care facility, please use the link below. It will take you to a list of all of our available [facility], including address, phone number and hours of operation. Please do not delay care.  [facility]  If you or a family member do not have a primary care provider, use the link below to schedule a visit and establish care. When you choose a [facility] primary care physician or advanced practice provider, you gain a long-term partner in health. Find a Primary Care Provider  Learn more about [facility]'s in-office and virtual care options: [facility] - Get Care Now

## 2023-09-18 ENCOUNTER — Other Ambulatory Visit (HOSPITAL_COMMUNITY): Payer: Self-pay

## 2023-09-18 ENCOUNTER — Other Ambulatory Visit: Payer: Self-pay

## 2023-09-19 ENCOUNTER — Other Ambulatory Visit (HOSPITAL_COMMUNITY): Payer: Self-pay

## 2023-09-19 ENCOUNTER — Encounter: Payer: Self-pay | Admitting: Pharmacist

## 2023-09-19 ENCOUNTER — Other Ambulatory Visit: Payer: Self-pay

## 2023-10-29 ENCOUNTER — Ambulatory Visit: Payer: 59 | Admitting: Obstetrics and Gynecology

## 2023-11-07 ENCOUNTER — Other Ambulatory Visit (HOSPITAL_BASED_OUTPATIENT_CLINIC_OR_DEPARTMENT_OTHER): Payer: Self-pay

## 2023-11-07 ENCOUNTER — Other Ambulatory Visit: Payer: Self-pay | Admitting: Obstetrics and Gynecology

## 2023-11-07 DIAGNOSIS — B0089 Other herpesviral infection: Secondary | ICD-10-CM

## 2023-11-07 NOTE — Telephone Encounter (Signed)
 Med refill request: valacyclovir  1000 mg Last AEX: 10/26/22  Next AEX: 12/05/23 Last MMG (if hormonal med) 05/10/23 BI-RADS 1 negative Refill authorized: Please Advise?

## 2023-11-08 ENCOUNTER — Other Ambulatory Visit (HOSPITAL_BASED_OUTPATIENT_CLINIC_OR_DEPARTMENT_OTHER): Payer: Self-pay

## 2023-11-08 MED ORDER — VALACYCLOVIR HCL 1 G PO TABS
ORAL_TABLET | ORAL | 0 refills | Status: DC
  Filled 2023-11-08: qty 20, 5d supply, fill #0

## 2023-11-27 ENCOUNTER — Encounter: Payer: Self-pay | Admitting: Family Medicine

## 2023-12-04 NOTE — Progress Notes (Unsigned)
 53 y.o. G66P2002 Married Caucasian female here for annual exam.    PCP: Mahlon Comer BRAVO, MD   No LMP recorded.           Sexually active: Yes.    The current method of family planning is vasectomy.    Menopausal hormone therapy:  Estrace  & progesterone   Exercising: {yes no:314532}  {types:19826} Smoker:  no  OB History  Gravida Para Term Preterm AB Living  2 2 2   2   SAB IAB Ectopic Multiple Live Births      2    # Outcome Date GA Lbr Len/2nd Weight Sex Type Anes PTL Lv  2 Term         LIV  1 Term         LIV     HEALTH MAINTENANCE: Last 2 paps:  09/18/18 neg HPV neg, 06/30/14 neg History of abnormal Pap or positive HPV:  no Mammogram:   05/10/23 Breast Density Cat C, BIRADS Cat 1 neg  Colonoscopy:  11/11/20 Cologuard  Bone Density:  n/a  Result  n/a   Immunization History  Administered Date(s) Administered   Influenza,trivalent, recombinat, inj, PF 01/18/2022   Influenza-Unspecified 01/08/2021   PFIZER Comirnaty(Gray Top)Covid-19 Tri-Sucrose Vaccine 11/12/2020   PFIZER(Purple Top)SARS-COV-2 Vaccination 04/19/2019, 05/09/2019   Pfizer Covid-19 Vaccine Bivalent Booster 45yrs & up 02/06/2020   Tdap 05/23/2021      reports that she has never smoked. She has never used smokeless tobacco. She reports current alcohol use of about 3.0 - 4.0 standard drinks of alcohol per week. She reports that she does not use drugs.  Past Medical History:  Diagnosis Date   Abnormal Pap smear of cervix    Anemia 2020   COVID 08/2020   GERD (gastroesophageal reflux disease)    history of   HSV-1 (herpes simplex virus 1) infection    Melanoma (HCC) 11/2012   left thigh   Migraine with visual aura    PONV (postoperative nausea and vomiting)     Past Surgical History:  Procedure Laterality Date   COLPOSCOPY  05/2010   Mild Dysplasia   DILITATION & CURRETTAGE/HYSTROSCOPY WITH NOVASURE ABLATION N/A 11/28/2016   Procedure: DILATATION & CURETTAGE/HYSTEROSCOPY WITH ROLLER BALL ABLATION  W/BIPOLAR CAUTERY AND ATTEMPTED NOVASURE ABLATION;  Surgeon: Cathlyn JAYSON Nikki Bobie BRAVO, MD;  Location: [facility];  Service: Gynecology;  Laterality: N/A;   ENDOSCOPIC VEIN LASER TREATMENT Right 07/25/2016   FINGER ARTHROSCOPY WITH CARPOMETACARPEL [facility]) ARTHROPLASTY Right 05/16/2022   Procedure: RIGHT THUMB CARPOMETACARPEL (CMC) ARTHROPLASTY WITH DOULBE TENDON TRANSFER AND REPAIR WITH FIBER LOCK SUSPENSION AS NECESSARY;  Surgeon: Camella Fallow, MD;  Location: [facility] OR;  Service: Orthopedics;  Laterality: Right;  REGIONAL WITH IV SEDATION   melanoma removal  11/2012   -left thigh   TENDON TRANSFER Left 01/13/2020   WRIST FRACTURE SURGERY Left     Current Outpatient Medications  Medication Sig Dispense Refill   ASHWAGANDHA PO Take 1 capsule by mouth daily.     B Complex-C (B-COMPLEX WITH VITAMIN C) tablet Take 1 tablet by mouth daily.     cetirizine (ZYRTEC) 10 MG tablet Take 10 mg by mouth daily as needed for allergies.      Cholecalciferol (VITAMIN D ) 125 MCG (5000 UT) CAPS Take 5,000 Units by mouth daily.     cycloSPORINE  (RESTASIS ) 0.05 % ophthalmic emulsion Place 1 drop into both eyes every 12 (twelve) hours. 180 each 4   DHEA 25 MG CAPS Take 1 capsule (25 mg total)  by mouth daily. 90 capsule 1   estradiol  (ESTRACE ) 1 MG tablet Take 1 tablet (1 mg total) by mouth daily. 90 tablet 1   FLUoxetine  (PROZAC ) 10 MG tablet Take 1 tablet (10 mg total) by mouth daily. 90 tablet 1   Iron Combinations (IRON COMPLEX PO) Take 20 mg of iron by mouth daily.     magnesium 30 MG tablet Take 30 mg by mouth at bedtime.     Omega-3 Fatty Acids (FISH OIL) 1000 MG CAPS Take 1,000 mg by mouth daily.     progesterone  (PROMETRIUM ) 100 MG capsule Take 1 capsule (100 mg total) by mouth daily. 90 capsule 1   TURMERIC PO Take 1 capsule by mouth daily.     valACYclovir  (VALTREX ) 1000 MG tablet Take 2 tablets by mouth now, repeat in 12 hours as needed for an outbreak. 20 tablet 0   No current facility-administered  medications for this visit.    ALLERGIES: Codeine  Family History  Problem Relation Age of Onset   Hypertension Mother    Osteoporosis Mother    Breast cancer Mother        postmenopausal, age 64   Hyperlipidemia Father    Prostate cancer Father    Cancer Father        bladder ca   Heart disease Paternal Grandmother     Review of Systems  PHYSICAL EXAM:  There were no vitals taken for this visit.    General appearance: alert, cooperative and appears stated age Head: normocephalic, without obvious abnormality, atraumatic Neck: no adenopathy, supple, symmetrical, trachea midline and thyroid  normal to inspection and palpation Lungs: clear to auscultation bilaterally Breasts: normal appearance, no masses or tenderness, No nipple retraction or dimpling, No nipple discharge or bleeding, No axillary adenopathy Heart: regular rate and rhythm Abdomen: soft, non-tender; no masses, no organomegaly Extremities: extremities normal, atraumatic, no cyanosis or edema Skin: skin color, texture, turgor normal. No rashes or lesions Lymph nodes: cervical, supraclavicular, and axillary nodes normal. Neurologic: grossly normal  Pelvic: External genitalia:  no lesions              No abnormal inguinal nodes palpated.              Urethra:  normal appearing urethra with no masses, tenderness or lesions              Bartholins and Skenes: normal                 Vagina: normal appearing vagina with normal color and discharge, no lesions              Cervix: no lesions              Pap taken: {yes no:314532} Bimanual Exam:  Uterus:  normal size, contour, position, consistency, mobility, non-tender              Adnexa: no mass, fullness, tenderness              Rectal exam: {yes no:314532}.  Confirms.              Anus:  normal sphincter tone, no lesions  Chaperone was present for exam:  {BSCHAPERONE:31226::Emily F, CMA}  ASSESSMENT: Well woman visit with gynecologic exam.  PHQ-2-9:  ***  ***  PLAN: Mammogram screening discussed. Self breast awareness reviewed. Pap and HRV collected:  {yes no:314532} Guidelines for Calcium, Vitamin D , regular exercise program including cardiovascular and weight bearing exercise. Medication refills:  *** {LABS (Optional):23779}  Follow up:  ***    Additional counseling given.  {yes [id number]. ***  total time was spent for this patient encounter, including preparation, face-to-face counseling with the patient, coordination of care, and documentation of the encounter in addition to doing the well woman visit with gynecologic exam.

## 2023-12-05 ENCOUNTER — Other Ambulatory Visit (HOSPITAL_COMMUNITY)
Admission: RE | Admit: 2023-12-05 | Discharge: 2023-12-05 | Disposition: A | Discharge status: Home / Self Care | Source: Ambulatory Visit | Attending: Obstetrics and Gynecology | Admitting: Obstetrics and Gynecology

## 2023-12-05 ENCOUNTER — Encounter: Payer: Self-pay | Admitting: Obstetrics and Gynecology

## 2023-12-05 ENCOUNTER — Ambulatory Visit (INDEPENDENT_AMBULATORY_CARE_PROVIDER_SITE_OTHER): Admitting: Obstetrics and Gynecology

## 2023-12-05 ENCOUNTER — Other Ambulatory Visit (HOSPITAL_COMMUNITY): Payer: Self-pay

## 2023-12-05 VITALS — BP 116/78 | HR 63 | Ht 69.5 in | Wt 172.0 lb

## 2023-12-05 DIAGNOSIS — Z124 Encounter for screening for malignant neoplasm of cervix: Secondary | ICD-10-CM

## 2023-12-05 DIAGNOSIS — Z01419 Encounter for gynecological examination (general) (routine) without abnormal findings: Secondary | ICD-10-CM | POA: Diagnosis not present

## 2023-12-05 DIAGNOSIS — Z1211 Encounter for screening for malignant neoplasm of colon: Secondary | ICD-10-CM

## 2023-12-05 DIAGNOSIS — B0089 Other herpesviral infection: Secondary | ICD-10-CM

## 2023-12-05 DIAGNOSIS — Z1331 Encounter for screening for depression: Secondary | ICD-10-CM

## 2023-12-05 MED ORDER — VALACYCLOVIR HCL 1 G PO TABS
ORAL_TABLET | ORAL | 2 refills | Status: AC
  Filled 2023-12-05: qty 20, 5d supply, fill #0

## 2023-12-05 NOTE — Patient Instructions (Signed)

## 2023-12-06 ENCOUNTER — Ambulatory Visit: Payer: Self-pay | Admitting: Obstetrics and Gynecology

## 2023-12-06 LAB — CYTOLOGY - PAP
Comment: NEGATIVE
Diagnosis: NEGATIVE
High risk HPV: NEGATIVE

## 2023-12-14 ENCOUNTER — Other Ambulatory Visit (HOSPITAL_COMMUNITY): Payer: Self-pay

## 2023-12-20 DIAGNOSIS — Z1211 Encounter for screening for malignant neoplasm of colon: Secondary | ICD-10-CM | POA: Diagnosis not present

## 2023-12-27 LAB — COLOGUARD: COLOGUARD: NEGATIVE

## 2024-01-01 DIAGNOSIS — M2042 Other hammer toe(s) (acquired), left foot: Secondary | ICD-10-CM | POA: Insufficient documentation

## 2024-01-02 ENCOUNTER — Ambulatory Visit: Payer: Self-pay | Admitting: Family Medicine

## 2024-01-02 ENCOUNTER — Encounter: Payer: Self-pay | Admitting: Family Medicine

## 2024-01-02 ENCOUNTER — Telehealth: Payer: Self-pay

## 2024-01-02 ENCOUNTER — Ambulatory Visit (INDEPENDENT_AMBULATORY_CARE_PROVIDER_SITE_OTHER): Payer: Self-pay | Admitting: Family Medicine

## 2024-01-02 ENCOUNTER — Other Ambulatory Visit (HOSPITAL_BASED_OUTPATIENT_CLINIC_OR_DEPARTMENT_OTHER): Payer: Self-pay

## 2024-01-02 VITALS — BP 122/68 | HR 68 | Temp 98.3°F | Ht 69.5 in | Wt 174.0 lb

## 2024-01-02 DIAGNOSIS — Z0184 Encounter for antibody response examination: Secondary | ICD-10-CM | POA: Diagnosis not present

## 2024-01-02 DIAGNOSIS — E559 Vitamin D deficiency, unspecified: Secondary | ICD-10-CM | POA: Diagnosis not present

## 2024-01-02 DIAGNOSIS — Z0000 Encounter for general adult medical examination without abnormal findings: Secondary | ICD-10-CM

## 2024-01-02 DIAGNOSIS — R5383 Other fatigue: Secondary | ICD-10-CM | POA: Diagnosis not present

## 2024-01-02 DIAGNOSIS — E663 Overweight: Secondary | ICD-10-CM | POA: Diagnosis not present

## 2024-01-02 LAB — HEPATIC FUNCTION PANEL
ALT: 14 U/L (ref 0–35)
AST: 19 U/L (ref 0–37)
Albumin: 4.4 g/dL (ref 3.5–5.2)
Alkaline Phosphatase: 59 U/L (ref 39–117)
Bilirubin, Direct: 0.1 mg/dL (ref 0.0–0.3)
Total Bilirubin: 0.5 mg/dL (ref 0.2–1.2)
Total Protein: 7 g/dL (ref 6.0–8.3)

## 2024-01-02 LAB — BASIC METABOLIC PANEL WITH GFR
BUN: 17 mg/dL (ref 6–23)
CO2: 31 meq/L (ref 19–32)
Calcium: 9.5 mg/dL (ref 8.4–10.5)
Chloride: 102 meq/L (ref 96–112)
Creatinine, Ser: 0.8 mg/dL (ref 0.40–1.20)
GFR: 83.62 mL/min (ref >60.00)
Glucose, Bld: 84 mg/dL (ref 70–99)
Potassium: 4.4 meq/L (ref 3.5–5.1)
Sodium: 139 meq/L (ref 135–145)

## 2024-01-02 LAB — LIPID PANEL
Cholesterol: 196 mg/dL (ref 0–200)
HDL: 83.4 mg/dL (ref >39.00)
LDL Cholesterol: 100 mg/dL — ABNORMAL HIGH (ref 0–99)
NonHDL: 112.58
Total CHOL/HDL Ratio: 2
Triglycerides: 63 mg/dL (ref 0.0–149.0)
VLDL: 12.6 mg/dL (ref 0.0–40.0)

## 2024-01-02 LAB — CBC WITH DIFFERENTIAL/PLATELET
Basophils Absolute: 0 K/uL (ref 0.0–0.1)
Basophils Relative: 0.5 % (ref 0.0–3.0)
Eosinophils Absolute: 0.1 K/uL (ref 0.0–0.7)
Eosinophils Relative: 3.1 % (ref 0.0–5.0)
HCT: 42.4 % (ref 36.0–46.0)
Hemoglobin: 14.2 g/dL (ref 12.0–15.0)
Lymphocytes Relative: 24.5 % (ref 12.0–46.0)
Lymphs Abs: 1.1 K/uL (ref 0.7–4.0)
MCHC: 33.4 g/dL (ref 30.0–36.0)
MCV: 86.8 fl (ref 78.0–100.0)
Monocytes Absolute: 0.3 K/uL (ref 0.1–1.0)
Monocytes Relative: 6.7 % (ref 3.0–12.0)
Neutro Abs: 2.8 K/uL (ref 1.4–7.7)
Neutrophils Relative %: 65.2 % (ref 43.0–77.0)
Platelets: 199 K/uL (ref 150.0–400.0)
RBC: 4.89 Mil/uL (ref 3.87–5.11)
RDW: 13.7 % (ref 11.5–15.5)
WBC: 4.3 K/uL (ref 4.0–10.5)

## 2024-01-02 LAB — T3, FREE: T3, Free: 3.4 pg/mL (ref 2.3–4.2)

## 2024-01-02 LAB — VITAMIN D 25 HYDROXY (VIT D DEFICIENCY, FRACTURES): VITD: 32.21 ng/mL (ref 30.00–100.00)

## 2024-01-02 LAB — TSH: TSH: 0.75 u[IU]/mL (ref 0.35–5.50)

## 2024-01-02 LAB — T4, FREE: Free T4: 0.79 ng/dL (ref 0.60–1.60)

## 2024-01-02 MED ORDER — FLUOXETINE HCL 10 MG PO TABS
10.0000 mg | ORAL_TABLET | Freq: Every day | ORAL | 1 refills | Status: AC
  Filled 2024-01-02 – 2024-01-27 (×2): qty 90, 90d supply, fill #0
  Filled 2024-04-26: qty 90, 90d supply, fill #1

## 2024-01-02 NOTE — Patient Instructions (Signed)
Follow up in 1 year or as needed We'll notify you of your lab results and make any changes if needed Keep up the good work on healthy diet and regular exercise- you look great! Call with any questions or concerns Stay Safe!  Stay Healthy! Happy Fall!!!

## 2024-01-02 NOTE — Telephone Encounter (Signed)
 Called patient to get CPE scheduled around 01/07/25. Unable to leave vm to return call.

## 2024-01-02 NOTE — Assessment & Plan Note (Signed)
 Pt's PE WNL.  UTD on pap, mammo, colonoscopy, Tdap.  Will get flu shot at work.  Declines PNA.  Will get varicella titers today to determine if Shingrix is needed.  Check labs.  Anticipatory guidance provided.

## 2024-01-02 NOTE — Progress Notes (Signed)
   Subjective:    Patient ID: Ashley Quinn, female    DOB: 01/17/1971, 53 y.o.   MRN: [id number]  HPI CPE- UTD on mammo, pap, colonoscopy, Tdap.  Declines PNA.  Would like varicella titers prior to shingles vaccine.  Patient Care Team    Relationship Specialty Notifications Start End  Mahlon Comer BRAVO, MD PCP - General Family Medicine  05/23/21   Cathlyn JAYSON Nikki Bobie BRAVO, MD Consulting Physician Obstetrics and Gynecology  09/30/19     Health Maintenance  Topic Date Due   Hepatitis B Vaccines 19-59 Average Risk (1 of 3 - 19+ 3-dose series) Never done   Pneumococcal Vaccine: 50+ Years (1 of 1 - PCV) Never done   Zoster Vaccines- Shingrix (1 of 2) Never done   Influenza Vaccine  11/09/2023   Mammogram  05/09/2025   Cervical Cancer Screening (HPV/Pap Cotest)  12/04/2028   Colonoscopy  11/12/2030   DTaP/Tdap/Td (2 - Td or Tdap) 05/24/2031   Hepatitis C Screening  Completed   HIV Screening  Completed   HPV VACCINES  Aged Out   Meningococcal B Vaccine  Aged Out   COVID-19 Vaccine  Discontinued      Review of Systems Patient reports no vision/ hearing changes, adenopathy,fever, weight change,  persistant/recurrent hoarseness , swallowing issues, chest pain, palpitations, edema, persistant/recurrent cough, hemoptysis, dyspnea (rest/exertional/paroxysmal nocturnal), gastrointestinal bleeding (melena, rectal bleeding), abdominal pain, significant heartburn, bowel changes, GU symptoms (dysuria, hematuria, incontinence), Gyn symptoms (abnormal  bleeding, pain),  syncope, focal weakness, memory loss, numbness & tingling, skin/nail changes, abnormal bruising or bleeding, anxiety, or depression.   + hair loss + fatigue    Objective:   Physical Exam General Appearance:    Alert, cooperative, no distress, appears stated age  Head:    Normocephalic, without obvious abnormality, atraumatic  Eyes:    PERRL, conjunctiva/corneas clear, EOM's intact both eyes  Ears:    Normal TM's and  external ear canals, both ears  Nose:   Nares normal, septum midline, mucosa normal, no drainage    or sinus tenderness  Throat:   Lips, mucosa, and tongue normal; teeth and gums normal  Neck:   Supple, symmetrical, trachea midline, no adenopathy;    Thyroid : no enlargement/tenderness/nodules  Back:     Symmetric, no curvature, ROM normal, no CVA tenderness  Lungs:     Clear to auscultation bilaterally, respirations unlabored  Chest Wall:    No tenderness or deformity   Heart:    Regular rate and rhythm, S1 and S2 normal, no murmur, rub   or gallop  Breast Exam:    Deferred to GYN  Abdomen:     Soft, non-tender, bowel sounds active all four quadrants,    no masses, no organomegaly  Genitalia:    Deferred to GYN  Rectal:    Extremities:   Extremities normal, atraumatic, no cyanosis or edema  Pulses:   2+ and symmetric all extremities  Skin:   Skin color, texture, turgor normal, no rashes or lesions  Lymph nodes:   Cervical, supraclavicular, and axillary nodes normal  Neurologic:   CNII-XII intact, normal strength, sensation and reflexes    throughout          Assessment & Plan:

## 2024-01-03 LAB — IRON,TIBC AND FERRITIN PANEL
%SAT: 35 % (ref 16–45)
Ferritin: 31 ng/mL (ref 16–232)
Iron: 124 ug/dL (ref 45–160)
TIBC: 359 ug/dL (ref 250–450)

## 2024-01-03 LAB — VARICELLA ZOSTER ANTIBODY, IGG: Varicella IgG: 28.2 {s_co_ratio}

## 2024-01-03 NOTE — Telephone Encounter (Signed)
 Patient has been scheduled

## 2024-01-27 ENCOUNTER — Other Ambulatory Visit (HOSPITAL_BASED_OUTPATIENT_CLINIC_OR_DEPARTMENT_OTHER): Payer: Self-pay

## 2024-01-28 ENCOUNTER — Other Ambulatory Visit (HOSPITAL_BASED_OUTPATIENT_CLINIC_OR_DEPARTMENT_OTHER): Payer: Self-pay

## 2024-01-30 ENCOUNTER — Encounter: Admitting: Family Medicine

## 2024-03-14 ENCOUNTER — Other Ambulatory Visit: Payer: Self-pay | Admitting: Family Medicine

## 2024-03-17 ENCOUNTER — Other Ambulatory Visit: Payer: Self-pay

## 2024-03-17 ENCOUNTER — Other Ambulatory Visit (HOSPITAL_BASED_OUTPATIENT_CLINIC_OR_DEPARTMENT_OTHER): Payer: Self-pay

## 2024-03-17 MED ORDER — ESTRADIOL 1 MG PO TABS
1.0000 mg | ORAL_TABLET | Freq: Every day | ORAL | 1 refills | Status: AC
  Filled 2024-03-17: qty 90, 90d supply, fill #0

## 2024-03-17 MED ORDER — PROGESTERONE MICRONIZED 100 MG PO CAPS
100.0000 mg | ORAL_CAPSULE | Freq: Every day | ORAL | 1 refills | Status: AC
  Filled 2024-03-17: qty 90, 90d supply, fill #0

## 2024-03-19 ENCOUNTER — Other Ambulatory Visit (HOSPITAL_BASED_OUTPATIENT_CLINIC_OR_DEPARTMENT_OTHER): Payer: Self-pay

## 2024-04-29 ENCOUNTER — Other Ambulatory Visit (HOSPITAL_BASED_OUTPATIENT_CLINIC_OR_DEPARTMENT_OTHER): Payer: Self-pay

## 2024-05-14 ENCOUNTER — Other Ambulatory Visit (HOSPITAL_BASED_OUTPATIENT_CLINIC_OR_DEPARTMENT_OTHER): Payer: Self-pay

## 2024-05-14 MED ORDER — CLINDAMYCIN PHOS-BENZOYL PEROX 1.2-5 % EX GEL
CUTANEOUS | 11 refills | Status: AC
  Filled 2024-05-14: qty 45, 90d supply, fill #0

## 2024-05-16 ENCOUNTER — Other Ambulatory Visit: Payer: Self-pay | Admitting: Obstetrics and Gynecology

## 2024-05-16 DIAGNOSIS — Z1231 Encounter for screening mammogram for malignant neoplasm of breast: Secondary | ICD-10-CM

## 2024-05-23 ENCOUNTER — Ambulatory Visit

## 2025-01-02 ENCOUNTER — Encounter: Admitting: Family Medicine
